# Patient Record
Sex: Female | Born: 1935 | Race: White | Hispanic: No | Smoking: Never smoker
Health system: Southern US, Community
[De-identification: ages and names within clinical notes are randomized; demographics above are authoritative.]

## PROBLEM LIST (undated history)

## (undated) DIAGNOSIS — M79602 Pain in left arm: Secondary | ICD-10-CM

## (undated) DIAGNOSIS — M5412 Radiculopathy, cervical region: Secondary | ICD-10-CM

## (undated) DIAGNOSIS — M1990 Unspecified osteoarthritis, unspecified site: Secondary | ICD-10-CM

---

## 2010-08-26 ENCOUNTER — Ambulatory Visit: Payer: Self-pay | Admitting: Internal Medicine

## 2010-11-03 ENCOUNTER — Ambulatory Visit: Payer: Self-pay | Admitting: Internal Medicine

## 2011-06-09 ENCOUNTER — Ambulatory Visit: Payer: Self-pay | Admitting: Internal Medicine

## 2014-10-10 ENCOUNTER — Emergency Department: Payer: Self-pay | Admitting: Emergency Medicine

## 2014-10-10 LAB — COMPREHENSIVE METABOLIC PANEL
ALT: 13 U/L — AB
ANION GAP: 7 (ref 7–16)
Albumin: 4.5 g/dL
Alkaline Phosphatase: 44 U/L
BILIRUBIN TOTAL: 0.8 mg/dL
BUN: 16 mg/dL
CREATININE: 0.9 mg/dL
Calcium, Total: 9.5 mg/dL
Chloride: 99 mmol/L — ABNORMAL LOW
Co2: 28 mmol/L
EGFR (African American): >60
EGFR (Non-African Amer.): 57 — ABNORMAL LOW
Glucose: 144 mg/dL — ABNORMAL HIGH
POTASSIUM: 4.7 mmol/L
SGOT(AST): 18 U/L
SODIUM: 134 mmol/L — AB
TOTAL PROTEIN: 7.6 g/dL

## 2014-10-10 LAB — URINALYSIS, COMPLETE
BILIRUBIN, UR: NEGATIVE
BLOOD: NEGATIVE
Glucose,UR: NEGATIVE mg/dL (ref 0–75)
Ketone: NEGATIVE
Leukocyte Esterase: NEGATIVE
Nitrite: NEGATIVE
PH: 7 (ref 4.5–8.0)
RBC,UR: 1 /HPF (ref 0–5)
Specific Gravity: 1.017 (ref 1.003–1.030)
WBC UR: 2 /HPF (ref 0–5)

## 2014-10-10 LAB — CBC
HCT: 38.8 % (ref 35.0–47.0)
HGB: 12.9 g/dL (ref 12.0–16.0)
MCH: 31.6 pg (ref 26.0–34.0)
MCHC: 33.4 g/dL (ref 32.0–36.0)
MCV: 95 fL (ref 80–100)
Platelet: 300 10*3/uL (ref 150–440)
RBC: 4.1 10*6/uL (ref 3.80–5.20)
RDW: 12 % (ref 11.5–14.5)
WBC: 6.1 10*3/uL (ref 3.6–11.0)

## 2014-10-10 LAB — TROPONIN I: Troponin-I: <0.03 ng/mL

## 2015-07-30 ENCOUNTER — Emergency Department
Admission: EM | Admit: 2015-07-30 | Discharge: 2015-07-30 | Disposition: A | Discharge status: Home / Self Care | Payer: Medicare Other | Attending: Emergency Medicine | Admitting: Emergency Medicine

## 2015-07-30 ENCOUNTER — Emergency Department: Payer: Medicare Other

## 2015-07-30 DIAGNOSIS — R0789 Other chest pain: Secondary | ICD-10-CM | POA: Insufficient documentation

## 2015-07-30 DIAGNOSIS — M25511 Pain in right shoulder: Secondary | ICD-10-CM | POA: Diagnosis present

## 2015-07-30 DIAGNOSIS — R011 Cardiac murmur, unspecified: Secondary | ICD-10-CM | POA: Diagnosis not present

## 2015-07-30 LAB — BASIC METABOLIC PANEL
ANION GAP: 10 (ref 5–15)
BUN: 23 mg/dL — ABNORMAL HIGH (ref 6–20)
CO2: 24 mmol/L (ref 22–32)
CREATININE: 0.9 mg/dL (ref 0.44–1.00)
Calcium: 9.7 mg/dL (ref 8.9–10.3)
Chloride: 102 mmol/L (ref 101–111)
GFR calc non Af Amer: 55 mL/min — ABNORMAL LOW (ref >60)
Glucose, Bld: 129 mg/dL — ABNORMAL HIGH (ref 65–99)
Potassium: 4.9 mmol/L (ref 3.5–5.1)
SODIUM: 136 mmol/L (ref 135–145)

## 2015-07-30 LAB — TROPONIN I: Troponin I: <0.03 ng/mL (ref <0.031)

## 2015-07-30 LAB — CBC
HEMATOCRIT: 37.4 % (ref 35.0–47.0)
Hemoglobin: 12.5 g/dL (ref 12.0–16.0)
MCH: 30.7 pg (ref 26.0–34.0)
MCHC: 33.5 g/dL (ref 32.0–36.0)
MCV: 91.7 fL (ref 80.0–100.0)
Platelets: 270 10*3/uL (ref 150–440)
RBC: 4.07 MIL/uL (ref 3.80–5.20)
RDW: 12.4 % (ref 11.5–14.5)
WBC: 6.4 10*3/uL (ref 3.6–11.0)

## 2015-07-30 MED ORDER — ACETAMINOPHEN 500 MG PO TABS: 1000.0000 mg | ORAL_TABLET | Freq: Once | ORAL | Status: DC

## 2015-07-30 NOTE — Discharge Instructions (Signed)

## 2015-07-30 NOTE — ED Notes (Signed)
Pt awoke with shoulder pain that has now resolved. Pt has no complaints at this time. Family at bedside, no needs identified at this time

## 2015-07-30 NOTE — ED Provider Notes (Signed)
[facility] Emergency Department Provider Note     Time seen: ----------------------------------------- 7:21 AM on 07/30/2015 -----------------------------------------    I have reviewed the triage vital signs and the nursing notes.   HISTORY  Chief Complaint Shoulder Pain    HPI Hannah Dillon is a 82 y.o. female who presents ER with right shoulder pain has now resolved. Family states she has not had any recent physical activity. Family states she does not do much of anything. She denies any fevers, chills, chest pain, shortness of breath, nausea vomiting or diarrhea. Patient does not have any tenderness in the right upper chest wall right shoulder area.   No past medical history on file.  There are no active problems to display for this patient.   No past surgical history on file.  Allergies Review of patient's allergies indicates no known allergies.  Social History Social History  Substance Use Topics  . Smoking status: Not on file  . Smokeless tobacco: Not on file  . Alcohol Use: Not on file    Review of Systems Constitutional: Negative for fever. Eyes: Negative for visual changes. ENT: Negative for sore throat. Cardiovascular: Positive for right upper chest and shoulder pain Respiratory: Negative for shortness of breath. Gastrointestinal: Negative for abdominal pain, vomiting and diarrhea. Genitourinary: Negative for dysuria. Musculoskeletal: Positive for right shoulder pain Skin: Negative for rash. Neurological: Negative for headaches, focal weakness or numbness.  10-point ROS otherwise negative.  ____________________________________________   PHYSICAL EXAM:  VITAL SIGNS: ED Triage Vitals  Enc Vitals Group     BP 07/30/15 0541 185/86 mmHg     Pulse Rate 07/30/15 0541 75     Resp 07/30/15 0541 18     Temp 07/30/15 0541 97.6 F (36.4 C)     Temp Source 07/30/15 0541 Oral     SpO2 07/30/15 0541 97 %     Weight 07/30/15  0541 110 lb (49.896 kg)     Height 07/30/15 0541 5\' 4"  (1.626 m)     Head Cir --      Peak Flow --      Pain Score 07/30/15 0543 10     Pain Loc --      Pain Edu? --      Excl. in GC? --     Constitutional: Alert and oriented. Well appearing and in no distress. Eyes: Conjunctivae are normal. PERRL. Normal extraocular movements. ENT   Head: Normocephalic and atraumatic.   Nose: No congestion/rhinnorhea.   Mouth/Throat: Mucous membranes are moist.   Neck: No stridor. Cardiovascular: Normal rate, regular rhythm. 2/6 systolic murmur. Respiratory: Normal respiratory effort without tachypnea nor retractions. Breath sounds are clear and equal bilaterally. No wheezes/rales/rhonchi. Gastrointestinal: Soft and nontender. No distention. No abdominal bruits.  Musculoskeletal: Nontender with normal range of motion in all extremities. No pain elicited with range of motion of the right shoulder. Neurologic:  Normal speech and language. No gross focal neurologic deficits are appreciated. Speech is normal. No gait instability. Skin:  Skin is warm, dry and intact. No rash noted. Psychiatric: Mood and affect are normal. Speech and behavior are normal. Patient exhibits appropriate insight and judgment. ____________________________________________  EKG: Interpreted by me. Sinus bradycardia with a rate of 56 bpm, normal PR interval, normal QRS, normal QT interval. Normal axis.  ____________________________________________  ED COURSE:  Pertinent labs & imaging results that were available during my care of the patient were reviewed by me and considered in my medical decision making (see chart for details). Patient  is no acute distress, likely noncardiac chest and shoulder pain. We'll check basic labs and x-ray and reevaluate. ____________________________________________    LABS (pertinent positives/negatives)  Labs Reviewed  BASIC METABOLIC PANEL - Abnormal; Notable for the following:     Glucose, Bld 129 (*)    BUN 23 (*)    GFR calc non Af Amer 55 (*)    All other components within normal limits  TROPONIN I  CBC    RADIOLOGY Images were viewed by me  Chest x-ray IMPRESSION: No active cardiopulmonary disease. ____________________________________________  FINAL ASSESSMENT AND PLAN  Shoulder pain  Plan: Patient with labs and imaging as dictated above. Workup to this point has been negative. I do not think this is pain related to acute coronary syndrome. This likely arthritis or other musculoskeletal pain. She is stable for outpatient follow-up with her doctor.   Emily FilbertWilliams, Kaitlin Ardito E, MD   Emily FilbertJonathan E Khori Underberg, MD 07/30/15 [phone]

## 2015-07-30 NOTE — ED Notes (Signed)
Pt brought to ER by son. Pt reports pain to her right shoulder area that woke her up from sleep. Pt denies other sx.

## 2015-08-10 ENCOUNTER — Ambulatory Visit
Admission: EM | Admit: 2015-08-10 | Discharge: 2015-08-10 | Disposition: A | Discharge status: Home / Self Care | Payer: Medicare Other | Attending: Family Medicine | Admitting: Family Medicine

## 2015-08-10 ENCOUNTER — Encounter: Payer: Self-pay | Admitting: *Deleted

## 2015-08-10 DIAGNOSIS — M25511 Pain in right shoulder: Secondary | ICD-10-CM

## 2015-08-10 MED ORDER — HYDROCODONE-ACETAMINOPHEN 5-325 MG PO TABS: ORAL_TABLET | ORAL | Status: DC

## 2015-08-10 NOTE — ED Notes (Addendum)
Pt states that she has had right shoulder pain for two weeks, has an appt scheduled to see her new PCP next week, Pt was seen at [facility] on 07/30/15 with same concerns.  Pt states that tylenol arthritis is helping pain.  Pt denies any pain at this time.

## 2015-08-10 NOTE — ED Provider Notes (Signed)
CSN: [id number]     Arrival date & time 08/10/15  1621 History   First MD Initiated Contact with Patient 08/10/15 1747     Chief Complaint  Patient presents with  . Shoulder Pain   (Consider location/radiation/quality/duration/timing/severity/associated sxs/prior Treatment) HPI Comments: 82 yo female with a 2-3 weeks h/o intermittent right shoulder pain. Denies any trauma, injuries, falls, swelling, redness, numbness/tingling.   The history is provided by the patient and a relative.    History reviewed. No pertinent past medical history. History reviewed. No pertinent past surgical history. No family history on file. Social History  Substance Use Topics  . Smoking status: Never Smoker   . Smokeless tobacco: None  . Alcohol Use: No   OB History    No data available     Review of Systems  Allergies  Review of patient's allergies indicates no known allergies.  Home Medications   Prior to Admission medications   Medication Sig Start Date End Date Taking? Authorizing Provider  acetaminophen (TYLENOL) 650 MG CR tablet Take 1,300 mg by mouth every 8 (eight) hours as needed for pain.   Yes Historical Provider, MD  HYDROcodone-acetaminophen (NORCO/VICODIN) 5-325 MG tablet 1 tab po qd prn 08/10/15   Payton Mccallum, MD   Meds Ordered and Administered this Visit  Medications - No data to display  BP 146/62 mmHg  Pulse 63  Temp(Src) 97.7 F (36.5 C)  Ht  (1.575 m)  Wt 110 lb (49.896 kg)  BMI 20.11 kg/m2  SpO2 98% No data found.   Physical Exam  Constitutional: She appears well-developed and well-nourished. No distress.  Musculoskeletal: She exhibits no edema.       Right shoulder: Normal.  Neurological: She is alert.  Skin: She is not diaphoretic.  Nursing note and vitals reviewed.   ED Course  Procedures (including critical care time)  Labs Review Labs Reviewed - No data to display  Imaging Review No results found.   Visual Acuity Review  Right Eye  Distance:   Left Eye Distance:   Bilateral Distance:    Right Eye Near:   Left Eye Near:    Bilateral Near:         MDM   1. Shoulder pain, right   (likely secondary to arthritis)    Discharge Medication List as of 08/10/2015  5:59 PM    START taking these medications   Details  HYDROcodone-acetaminophen (NORCO/VICODIN) 5-325 MG tablet 1 tab po qd prn, Print       1.  diagnosis reviewed with patient and son 2. rx as per orders above; reviewed possible side effects, interactions, risks and benefits  3. Recommend supportive treatment with heat, otc analgesics, range of motion exercises 4. Follow-up prn if symptoms worsen or don't improve    Payton Mccallum, MD 08/10/15 [phone]

## 2015-08-31 ENCOUNTER — Encounter: Payer: Self-pay | Admitting: *Deleted

## 2015-08-31 ENCOUNTER — Ambulatory Visit (INDEPENDENT_AMBULATORY_CARE_PROVIDER_SITE_OTHER): Payer: Medicare Other

## 2015-08-31 ENCOUNTER — Ambulatory Visit
Admission: EM | Admit: 2015-08-31 | Discharge: 2015-08-31 | Disposition: A | Discharge status: Home / Self Care | Payer: Medicare Other | Attending: Family Medicine | Admitting: Family Medicine

## 2015-08-31 DIAGNOSIS — M25512 Pain in left shoulder: Secondary | ICD-10-CM | POA: Diagnosis not present

## 2015-08-31 DIAGNOSIS — M25552 Pain in left hip: Secondary | ICD-10-CM | POA: Diagnosis not present

## 2015-08-31 DIAGNOSIS — M25511 Pain in right shoulder: Secondary | ICD-10-CM

## 2015-08-31 NOTE — ED Provider Notes (Signed)
[facility]  ____________________________________________  Time seen: Approximately 4:00PM  I have reviewed the triage vital signs and the nursing notes.   HISTORY  Chief Complaint Hip Pain and Arm Pain   HPI Hannah Dillon is a 82 y.o. female presents with son at bedside for the complaints of right and left shoulder pain as well as left hip pain. Patient and son reports that right and left shoulder pain has been present for about 2 months. Patient and son reports that patient has had right shoulder x-rays which only showed arthritis. Patient denies change in right shoulder pain. Patient states that pain at rest to shoulders or left hip. Patient states that pain is present with active range of motion. Reports right and left shoulder pain present 2 months, unchanged. Patient's son reports left hip pain 1 day.  Denies fall, trauma or known injury. Denies known trigger for pain. Denies pain radiations. States shoulder pains do not radiate and are not present at rest. States shoulder pains are only present with active range of motion. States that shoulder pains are fully reproducible with range of motion. States left hip pain is only present with ambulation.  Patient and son report that patient has been seen by urgent care, ER and PCP for shoulder pain. Reports that she still has Vicodin as needed for pain in which they take half of the tablet once to twice a day at most as needed. Also reports has been taking over-the-counter aleve. Reports of these pain medications do help with pain. Also reports they have Voltaren gel at home for shoulder pain as needed.  With patient sitting currently in chair, patient denies pain. While sitting patient denies pain and shoulders, hips or other pain. Denies chest pain, shortness of breath, dizziness, weakness, vision changes, dysuria, abdominal pain, back or neck pain.  Reports that they have an appointment with orthopedic PA Dedra Skeens tomorrow at  2:30 PM.  PCP: Elmer Ramp   History reviewed. No pertinent past medical history.  There are no active problems to display for this patient.   History reviewed. No pertinent past surgical history.  Current Outpatient Rx  Name  Route  Sig  Dispense  Refill  . acetaminophen (TYLENOL) 650 MG CR tablet   Oral   Take 1,300 mg by mouth every 8 (eight) hours as needed for pain.         Marland Kitchen HYDROcodone-acetaminophen (NORCO/VICODIN) 5-325 MG tablet      1 tab po qd prn   10 tablet   0     Allergies Review of patient's allergies indicates no known allergies.  History reviewed. No pertinent family history.  Social History Social History  Substance Use Topics  . Smoking status: Never Smoker   . Smokeless tobacco: None  . Alcohol Use: No    Review of Systems Constitutional: No fever/chills Eyes: No visual changes. ENT: No sore throat. Cardiovascular: Denies chest pain. Respiratory: Denies shortness of breath. Gastrointestinal: No abdominal pain.  No nausea, no vomiting.  No diarrhea.  No constipation. Genitourinary: Negative for dysuria. Musculoskeletal: Negative for back pain. Shoulder and left hip pain.  Skin: Negative for rash. Neurological: Negative for headaches, focal weakness or numbness.  10-point ROS otherwise negative.  ____________________________________________   PHYSICAL EXAM:  VITAL SIGNS: ED Triage Vitals  Enc Vitals Group     BP 08/31/15 1512 151/69 mmHg     Pulse Rate 08/31/15 1512 68     Resp 08/31/15 1512 18     Temp 08/31/15  1512 98.5 F (36.9 C)     Temp Source 08/31/15 1512 Oral     SpO2 08/31/15 1512 97 %     Weight 08/31/15 1512 125 lb (56.7 kg)     Height 08/31/15 1512  (1.626 m)     Head Cir --      Peak Flow --      Pain Score 08/31/15 1515 5     Pain Loc --      Pain Edu? --      Excl. in GC? --     Constitutional: Alert and oriented. Well appearing and in no acute distress. Eyes: Conjunctivae are normal. PERRL. EOMI. Head:  Atraumatic.   Nose: No congestion/rhinnorhea.  Mouth/Throat: Mucous membranes are moist.   Neck: No stridor.  No cervical spine tenderness to palpation. Hematological/Lymphatic/Immunilogical: No cervical lymphadenopathy. Cardiovascular: Normal rate, regular rhythm. Grossly normal heart sounds.  Good peripheral circulation. Respiratory: Normal respiratory effort.  No retractions. Lungs CTAB. Gastrointestinal: Soft and nontender. No distention. Normal Bowel sounds.  No abdominal bruits. No CVA tenderness. Musculoskeletal: No lower or upper extremity tenderness nor edema.  No joint effusions. Bilateral pedal pulses equal and easily palpated. No cervical, thoracic, or lumbar tenderness to palpation. Steady gait.  Except: Left lateral hip mild TTP, full ROM, no swelling or ecchymosis, no pain with flexion or abduction. Bilateral shoulders mild TTP anteriorly and proximally, pain increased with rotation, no distal upper extremity tenderness or pain, no swelling, no ecchymosis, full ROM, bilateral hand grips equal, bilateral distal radial pulses equal and easily palpable.  Neurologic:  Normal speech and language. No gross focal neurologic deficits are appreciated. No gait instability. Skin:  Skin is warm, dry and intact. No rash noted. Psychiatric: Mood and affect are normal. Speech and behavior are normal.  ____________________________________________   LABS (all labs ordered are listed, but only abnormal results are displayed)  Labs Reviewed - No data to display  RADIOLOGY  EXAM: LEFT SHOULDER - 2+ VIEW  COMPARISON: None.  FINDINGS: Visualized portion of the left hemithorax is normal. Degenerative irregularity of the acromioclavicular joint and rotator cuff insertion. No acute fracture or dislocation. There is also glenohumeral joint space narrowing and subchondral sclerosis.  IMPRESSION: Degenerative change, without acute osseous finding.   Electronically Signed By: Jeronimo Greaves  M.D. On: 08/31/2015 16:41          DG HIP UNILAT WITH PELVIS 2-3 VIEWS LEFT (Final result) Result time: 08/31/15 16:42:32   Final result by Rad Results In Interface (08/31/15 16:42:32)   Narrative:   CLINICAL DATA: 82 year old female with bilateral shoulder and left hip pain  EXAM: DG HIP (WITH OR WITHOUT PELVIS) 2-3V LEFT  COMPARISON: None.  FINDINGS: There is no evidence of hip fracture or dislocation. L4-L5 degenerative disc disease. No significant hip joint arthropathy.  IMPRESSION: 1. No acute fracture, malalignment, bony lesion or significant degenerative arthritis in the hip. 2. Lower lumbar degenerative disc disease.   Electronically Signed By: Malachy Moan M.D. On: 08/31/2015 16:42     I, Renford Dills, personally viewed and evaluated these images (plain radiographs) as part of my medical decision making, as well as reviewing the written report by the radiologist.  ____________________________________________   PROCEDURES  Procedure(s) performed:   Dan Humphreys sized and sent home with patient by RN.  ____________________________________________   INITIAL IMPRESSION / ASSESSMENT AND PLAN / ED COURSE  Pertinent labs & imaging results that were available during my care of the patient were reviewed by me and considered in  my medical decision making (see chart for details).  Very well-appearing patient. No acute distress. Alert and oriented. No focal neurological deficits. Presents with son at bedside who lives with patient. Presents for the complaint of bilateral shoulder pain 2 months as well as left hip pain 1 day. Denies fall or trauma. Denies known trigger. Patient quickly changes positions from sitting to standing in room. Patient quickly ambulatory in room. Patient with mild left antalgic gait but steady. Patient with full range of motion to hips as well as bilateral shoulders. Denies pain at rest. Mild TTP to shoulders and left hip with  direct palpation. Pain to shoulders and left hip with active range of motion. Patient reports that pain is fully reproducible by direct palpation and active range of motion. Suspect musculoskeletal pain.  Will evaluate x-rays of left shoulder as well as left hip. As patient without recent trauma and with recent right shoulder x-ray will avoid repeating x-ray at this time.  Left shoulder x-ray degenerative change without acute osseous finding per radiologist. Left hip no acute fracture, malalignment, bony lesion or significant degenerative arthritis in the hip, lumbar lower degenerative disc disease per radiologist.  Suspect musculoskeletal pain and suspect secondary to arthritic. Discussed close follow-up with PCP as well as following up with orthopedic tomorrow. Patient and son reports has medications at home that they can use for pain and denies request for additional medication. Full range of motion to bilateral upper and lower extremities in room. Mild antalgic gait. Walker sent home with patient and son.  Discussed follow up with Primary care physician this week. Discussed follow up and return parameters including no resolution or any worsening concerns. Patient verbalized understanding and agreed to plan.   ____________________________________________   FINAL CLINICAL IMPRESSION(S) / ED DIAGNOSES  Final diagnoses:  Left shoulder pain  Right shoulder pain  Left hip pain      Note: This dictation was prepared with Dragon dictation along with smaller phrase technology. Any transcriptional errors that result from this process are unintentional.    Renford Dills, NP 09/05/15 1749

## 2015-08-31 NOTE — ED Notes (Signed)
Patient started having left hip pain last PM before she went to sleep. Her left arm started hurting today.

## 2015-08-31 NOTE — Discharge Instructions (Signed)
Take home medication as prescribed. Use walker. Alternate heat and ice for comfort.   Follow up with orthopedic tomorrow as scheduled.   Follow up with your primary care physician this week as needed. Return to Urgent care for new or worsening concerns.    Musculoskeletal Pain Musculoskeletal pain is muscle and boney aches and pains. These pains can occur in any part of the body. Your caregiver may treat you without knowing the cause of the pain. They may treat you if blood or urine tests, X-rays, and other tests were normal.  CAUSES There is often not a definite cause or reason for these pains. These pains may be caused by a type of germ (virus). The discomfort may also come from overuse. Overuse includes working out too hard when your body is not fit. Boney aches also come from weather changes. Bone is sensitive to atmospheric pressure changes. HOME CARE INSTRUCTIONS   Ask when your test results will be ready. Make sure you get your test results.  Only take over-the-counter or prescription medicines for pain, discomfort, or fever as directed by your caregiver. If you were given medications for your condition, do not drive, operate machinery or power tools, or sign legal documents for 24 hours. Do not drink alcohol. Do not take sleeping pills or other medications that may interfere with treatment.  Continue all activities unless the activities cause more pain. When the pain lessens, slowly resume normal activities. Gradually increase the intensity and duration of the activities or exercise.  During periods of severe pain, bed rest may be helpful. Lay or sit in any position that is comfortable.  Putting ice on the injured area.  Put ice in a bag.  Place a towel between your skin and the bag.  Leave the ice on for 15 to 20 minutes, 3 to 4 times a day.  Follow up with your caregiver for continued problems and no reason can be found for the pain. If the pain becomes worse or does not go  away, it may be necessary to repeat tests or do additional testing. Your caregiver may need to look further for a possible cause. SEEK IMMEDIATE MEDICAL CARE IF:  You have pain that is getting worse and is not relieved by medications.  You develop chest pain that is associated with shortness or breath, sweating, feeling sick to your stomach (nauseous), or throw up (vomit).  Your pain becomes localized to the abdomen.  You develop any new symptoms that seem different or that concern you. MAKE SURE YOU:   Understand these instructions.  Will watch your condition.  Will get help right away if you are not doing well or get worse.   This information is not intended to replace advice given to you by your health care provider. Make sure you discuss any questions you have with your health care provider.   Document Released: 07/02/2005 Document Revised: 09/24/2011 Document Reviewed: 03/06/2013 Elsevier Interactive Patient Education 2016 [employer]  Hip Pain Your hip is the joint between your upper legs and your lower pelvis. The bones, cartilage, tendons, and muscles of your hip joint perform a lot of work each day supporting your body weight and allowing you to move around. Hip pain can range from a minor ache to severe pain in one or both of your hips. Pain may be felt on the inside of the hip joint near the groin, or the outside near the buttocks and upper thigh. You may have swelling or stiffness as  well.  HOME CARE INSTRUCTIONS   Take medicines only as directed by your health care provider.  Apply ice to the injured area:  Put ice in a plastic bag.  Place a towel between your skin and the bag.  Leave the ice on for 15-20 minutes at a time, 3-4 times a day.  Keep your leg raised (elevated) when possible to lessen swelling.  Avoid activities that cause pain.  Follow specific exercises as directed by your health care provider.  Sleep with a pillow between your legs on your  most comfortable side.  Record how often you have hip pain, the location of the pain, and what it feels like. SEEK MEDICAL CARE IF:   You are unable to put weight on your leg.  Your hip is red or swollen or very tender to touch.  Your pain or swelling continues or worsens after 1 week.  You have increasing difficulty walking.  You have a fever. SEEK IMMEDIATE MEDICAL CARE IF:   You have fallen.  You have a sudden increase in pain and swelling in your hip. MAKE SURE YOU:   Understand these instructions.  Will watch your condition.  Will get help right away if you are not doing well or get worse.   This information is not intended to replace advice given to you by your health care provider. Make sure you discuss any questions you have with your health care provider.   Document Released: 12/20/2009 Document Revised: 07/23/2014 Document Reviewed: 02/26/2013 Elsevier Interactive Patient Education 2016 [employer]  Shoulder Pain The shoulder is the joint that connects your arms to your body. The bones that form the shoulder joint include the upper arm bone (humerus), the shoulder blade (scapula), and the collarbone (clavicle). The top of the humerus is shaped like a ball and fits into a rather flat socket on the scapula (glenoid cavity). A combination of muscles and strong, fibrous tissues that connect muscles to bones (tendons) support your shoulder joint and hold the ball in the socket. Small, fluid-filled sacs (bursae) are located in different areas of the joint. They act as cushions between the bones and the overlying soft tissues and help reduce friction between the gliding tendons and the bone as you move your arm. Your shoulder joint allows a wide range of motion in your arm. This range of motion allows you to do things like scratch your back or throw a ball. However, this range of motion also makes your shoulder more prone to pain from overuse and injury. Causes of shoulder  pain can originate from both injury and overuse and usually can be grouped in the following four categories:  Redness, swelling, and pain (inflammation) of the tendon (tendinitis) or the bursae (bursitis).  Instability, such as a dislocation of the joint.  Inflammation of the joint (arthritis).  Broken bone (fracture). HOME CARE INSTRUCTIONS   Apply ice to the sore area.  Put ice in a plastic bag.  Place a towel between your skin and the bag.  Leave the ice on for 15-20 minutes, 3-4 times per day for the first 2 days, or as directed by your health care provider.  Stop using cold packs if they do not help with the pain.  If you have a shoulder sling or immobilizer, wear it as long as your caregiver instructs. Only remove it to shower or bathe. Move your arm as little as possible, but keep your hand moving to prevent swelling.  Squeeze a soft ball or  foam pad as much as possible to help prevent swelling.  Only take over-the-counter or prescription medicines for pain, discomfort, or fever as directed by your caregiver. SEEK MEDICAL CARE IF:   Your shoulder pain increases, or new pain develops in your arm, hand, or fingers.  Your hand or fingers become cold and numb.  Your pain is not relieved with medicines. SEEK IMMEDIATE MEDICAL CARE IF:   Your arm, hand, or fingers are numb or tingling.  Your arm, hand, or fingers are significantly swollen or turn white or blue. MAKE SURE YOU:   Understand these instructions.  Will watch your condition.  Will get help right away if you are not doing well or get worse.   This information is not intended to replace advice given to you by your health care provider. Make sure you discuss any questions you have with your health care provider.   Document Released: 04/11/2005 Document Revised: 07/23/2014 Document Reviewed: 10/25/2014 Elsevier Interactive Patient Education [employer]

## 2015-11-11 ENCOUNTER — Other Ambulatory Visit: Payer: Self-pay | Admitting: Orthopedic Surgery

## 2015-11-11 DIAGNOSIS — M5030 Other cervical disc degeneration, unspecified cervical region: Secondary | ICD-10-CM

## 2015-11-11 DIAGNOSIS — M5412 Radiculopathy, cervical region: Secondary | ICD-10-CM

## 2015-12-07 ENCOUNTER — Ambulatory Visit
Admission: RE | Admit: 2015-12-07 | Discharge: 2015-12-07 | Disposition: A | Discharge status: Home / Self Care | Payer: Medicare Other | Source: Ambulatory Visit | Attending: Orthopedic Surgery | Admitting: Orthopedic Surgery

## 2015-12-07 DIAGNOSIS — M50321 Other cervical disc degeneration at C4-C5 level: Secondary | ICD-10-CM | POA: Diagnosis not present

## 2015-12-07 DIAGNOSIS — M5030 Other cervical disc degeneration, unspecified cervical region: Secondary | ICD-10-CM

## 2015-12-07 DIAGNOSIS — M5412 Radiculopathy, cervical region: Secondary | ICD-10-CM | POA: Insufficient documentation

## 2015-12-07 DIAGNOSIS — M4802 Spinal stenosis, cervical region: Secondary | ICD-10-CM | POA: Insufficient documentation

## 2016-07-11 ENCOUNTER — Emergency Department
Admission: EM | Admit: 2016-07-11 | Discharge: 2016-07-11 | Disposition: A | Discharge status: Home / Self Care | Payer: Medicare Other | Attending: Emergency Medicine | Admitting: Emergency Medicine

## 2016-07-11 ENCOUNTER — Emergency Department: Payer: Medicare Other

## 2016-07-11 ENCOUNTER — Encounter: Payer: Self-pay | Admitting: Emergency Medicine

## 2016-07-11 DIAGNOSIS — R0602 Shortness of breath: Secondary | ICD-10-CM | POA: Diagnosis present

## 2016-07-11 DIAGNOSIS — I10 Essential (primary) hypertension: Secondary | ICD-10-CM | POA: Diagnosis not present

## 2016-07-11 HISTORY — DX: Unspecified osteoarthritis, unspecified site: M19.90

## 2016-07-11 HISTORY — DX: Pain in left arm: M79.602

## 2016-07-11 HISTORY — DX: Radiculopathy, cervical region: M54.12

## 2016-07-11 LAB — CBC
HEMATOCRIT: 35.8 % (ref 35.0–47.0)
HEMOGLOBIN: 12.2 g/dL (ref 12.0–16.0)
MCH: 32.6 pg (ref 26.0–34.0)
MCHC: 34.1 g/dL (ref 32.0–36.0)
MCV: 95.5 fL (ref 80.0–100.0)
Platelets: 340 10*3/uL (ref 150–440)
RBC: 3.74 MIL/uL — ABNORMAL LOW (ref 3.80–5.20)
RDW: 12.3 % (ref 11.5–14.5)
WBC: 8.4 10*3/uL (ref 3.6–11.0)

## 2016-07-11 LAB — TROPONIN I: Troponin I: <0.03 ng/mL (ref <0.03)

## 2016-07-11 LAB — BASIC METABOLIC PANEL
ANION GAP: 6 (ref 5–15)
BUN: 18 mg/dL (ref 6–20)
CHLORIDE: 101 mmol/L (ref 101–111)
CO2: 28 mmol/L (ref 22–32)
Calcium: 9.5 mg/dL (ref 8.9–10.3)
Creatinine, Ser: 0.77 mg/dL (ref 0.44–1.00)
GFR calc Af Amer: >60 mL/min (ref >60)
GFR calc non Af Amer: >60 mL/min (ref >60)
GLUCOSE: 92 mg/dL (ref 65–99)
POTASSIUM: 4.4 mmol/L (ref 3.5–5.1)
Sodium: 135 mmol/L (ref 135–145)

## 2016-07-11 NOTE — Discharge Instructions (Signed)
As we discussed, your workup today was reassuring.  Though we do not know exactly what is causing your symptoms, it appears that you have no emergent medical condition at this time and that you are safe to go home and follow up as recommended in this paperwork.  We also encourage you to follow up as soon as possible with your doctor to discuss her blood pressure.  It was elevated today in the emergency department, but it may be that it is normal the next time you are seen in clinic.  Regardless, please follow up with a discussion with your primary provider.  Please return immediately to the Emergency Department if you develop any new or worsening symptoms that concern you.

## 2016-07-11 NOTE — ED Triage Notes (Addendum)
Pt told son that she was having trouble breathing this morning when he came inside. Pt denies any pain r/t SHOB. Reports no longer feels like having any difficulty breathing and feels better now. Denies cough or fevers. No distress in triage. Pt reports soreness to left arm. Son thinks from disc problems.

## 2016-07-11 NOTE — ED Provider Notes (Signed)
[facility] Emergency Department Provider Note  ____________________________________________   First MD Initiated Contact with Patient 07/11/16 1457     (approximate)  I have reviewed the triage vital signs and the nursing notes.   HISTORY  Chief Complaint Shortness of Breath  Possible age-related dementia at baseline  HPI Hannah Dillon is a 82 y.o. female with no prior history of hypertension or heart disease who presents for evaluation of a brief episode of shortness of breath.  It occurred while she was at rest watching TV on the couch.  She cannot remember exactly what happenedbut she does recall being short of breath for a brief period of time.  The total duration of the symptoms was stiff for less than an hour but she and her son are not sure how long it lasted.  She definitively denies chest pain, nausea, vomiting, abdominal pain, sweating, headache.  She had no weakness nor dizziness.  She has chronic cervical radiculopathy with pain in her left arm but that is not new or different from usual.  She takes several different pain medicines for her radiculopathy but does not take an antiplatelet agent but she does take an NSAID.  The particular made the shortness of breath better or worse and it was mild in intensity.  She has no history of blood clots in her legs or lungs.   Past Medical History:  Diagnosis Date  . Arthritis   . Cervical radiculopathy   . DJD (degenerative joint disease)   . Left arm pain     There are no active problems to display for this patient.   History reviewed. No pertinent surgical history.  Prior to Admission medications   Medication Sig Start Date End Date Taking? Authorizing Provider  acetaminophen (TYLENOL) 650 MG CR tablet Take 1,300 mg by mouth every 8 (eight) hours as needed for pain.    Historical Provider, MD  HYDROcodone-acetaminophen (NORCO/VICODIN) 5-325 MG tablet 1 tab po qd prn 08/10/15   Payton Mccallum, MD      Allergies Patient has no known allergies.  History reviewed. No pertinent family history.  Social History Social History  Substance Use Topics  . Smoking status: Never Smoker  . Smokeless tobacco: Never Used  . Alcohol use No    Review of Systems Constitutional: No fever/chills Eyes: No visual changes. ENT: No sore throat. Cardiovascular: Denies chest pain. Respiratory: +shortness of breath, now resolved Gastrointestinal: No abdominal pain.  No nausea, no vomiting.  No diarrhea.  No constipation. Genitourinary: Negative for dysuria. Musculoskeletal: Negative for back pain. Skin: Negative for rash. Neurological: Negative for headaches, focal weakness or numbness.  10-point ROS otherwise negative.  ____________________________________________   PHYSICAL EXAM:  VITAL SIGNS: ED Triage Vitals [07/11/16 1213]  Enc Vitals Group     BP (!) 180/71     Pulse Rate 68     Resp 16     Temp 98 F (36.7 C)     Temp Source Oral     SpO2 97 %     Weight 125 lb (56.7 kg)     Height 5\' 4"  (1.626 m)     Head Circumference      Peak Flow      Pain Score      Pain Loc      Pain Edu?      Excl. in GC?     Constitutional: Alert and oriented. Well appearing and in no acute distress. Eyes: Conjunctivae are normal. PERRL. EOMI. Head:  Atraumatic. Nose: No congestion/rhinnorhea. Mouth/Throat: Mucous membranes are moist.  Oropharynx non-erythematous. Neck: No stridor.  No meningeal signs.   Cardiovascular: Normal rate, regular rhythm. Good peripheral circulation. Grossly normal heart sounds. Respiratory: Normal respiratory effort.  No retractions. Lungs CTAB. Gastrointestinal: Soft and nontender. No distention.  Musculoskeletal: No lower extremity tenderness nor edema. No gross deformities of extremities. Neurologic:  Normal speech and language. No gross focal neurologic deficits are appreciated.  Skin:  Skin is warm, dry and intact. No rash noted. Psychiatric: Mood and affect  are normal. Speech and behavior are normal.  ____________________________________________   LABS (all labs ordered are listed, but only abnormal results are displayed)  Labs Reviewed  CBC - Abnormal; Notable for the following:       Result Value   RBC 3.74 (*)    All other components within normal limits  BASIC METABOLIC PANEL  TROPONIN I   ____________________________________________  EKG  ED ECG REPORT I, Winson Eichorn, the attending physician, personally viewed and interpreted this ECG.  Date: 07/11/2016 EKG Time: 12:20 PM Rate: 62 Rhythm: normal sinus rhythm QRS Axis: normal Intervals: normal ST/T Wave abnormalities: Inverted T-wave in lead V2, otherwise unremarkable Conduction Disturbances: none Narrative Interpretation: unremarkable, with no evidence of acute ischemia  ____________________________________________  RADIOLOGY   Dg Chest 2 View  Result Date: 07/11/2016 CLINICAL DATA:  Pt having SOB for 1 week. No previous hx. Non smoker EXAM: CHEST  2 VIEW COMPARISON:  07/30/2015 FINDINGS: Normal cardiac silhouette. Lungs are hyperinflated. No effusion, infiltrate or pneumothorax. Degenerative osteophytosis of the spine. IMPRESSION: Hyperinflated lungs.  No acute cardiopulmonary findings. Electronically Signed   By: Genevive BiStewart  Edmunds M.D.   On: 07/11/2016 13:06    ____________________________________________   PROCEDURES  Procedure(s) performed:   Procedures   Critical Care performed: No ____________________________________________   INITIAL IMPRESSION / ASSESSMENT AND PLAN / ED COURSE  Pertinent labs & imaging results that were available during my care of the patient were reviewed by me and considered in my medical decision making (see chart for details).  Well appearing, NAD.  Reassuring physical exam.  I had a long discussion with the patient and her son about various etiologies of the shortness of breath, including pulmonary embolism, ACS, as well  as less potentially dangerous causes.  She is completely asymptomatic at this point and we all agreed that there is no benefit to additional testing and the patient wants to go home.  I also explained the possible utility of a repeat troponin  And they understand but feel that the workup so far has been reassuring and she has been asymptomatic throughout her emergency department stay.  I gave my usual and customary return precautions.  They will follow up with her primary care doctor.  I also encouraged her to speak with her primary care doctor about her blood pressure at the next available opportunity; she has no history of hypertension and she is consistently elevated today.  Given that she is asymptomatic, however, and has no evidence of end organ dysfunction, I believe that she will be better suited to outpatient follow-up rather than me start her on additional medication based on one visit in the ED.  ____________________________________________  FINAL CLINICAL IMPRESSION(S) / ED DIAGNOSES  Final diagnoses:  Shortness of breath  Hypertension, unspecified type     MEDICATIONS GIVEN DURING THIS VISIT:  Medications - No data to display   NEW OUTPATIENT MEDICATIONS STARTED DURING THIS VISIT:  New Prescriptions   No medications on file  Modified Medications   No medications on file    Discontinued Medications   No medications on file     Note:  This document was prepared using Dragon voice recognition software and may include unintentional dictation errors.    Loleta Roseory Tynesia Harral, MD 07/11/16 [phone]

## 2016-07-22 ENCOUNTER — Emergency Department
Admission: EM | Admit: 2016-07-22 | Discharge: 2016-07-22 | Disposition: A | Discharge status: Home / Self Care | Payer: Medicare Other | Attending: Emergency Medicine | Admitting: Emergency Medicine

## 2016-07-22 ENCOUNTER — Emergency Department: Payer: Medicare Other

## 2016-07-22 ENCOUNTER — Encounter: Payer: Self-pay | Admitting: Emergency Medicine

## 2016-07-22 DIAGNOSIS — R0602 Shortness of breath: Secondary | ICD-10-CM | POA: Diagnosis not present

## 2016-07-22 DIAGNOSIS — R42 Dizziness and giddiness: Secondary | ICD-10-CM | POA: Insufficient documentation

## 2016-07-22 DIAGNOSIS — N390 Urinary tract infection, site not specified: Secondary | ICD-10-CM | POA: Insufficient documentation

## 2016-07-22 DIAGNOSIS — R05 Cough: Secondary | ICD-10-CM | POA: Diagnosis present

## 2016-07-22 DIAGNOSIS — M25512 Pain in left shoulder: Secondary | ICD-10-CM | POA: Diagnosis not present

## 2016-07-22 LAB — URINALYSIS, COMPLETE (UACMP) WITH MICROSCOPIC
BILIRUBIN URINE: NEGATIVE
Glucose, UA: NEGATIVE mg/dL
Hgb urine dipstick: NEGATIVE
Ketones, ur: NEGATIVE mg/dL
Nitrite: POSITIVE — AB
PH: 9 — AB (ref 5.0–8.0)
Protein, ur: NEGATIVE mg/dL
SPECIFIC GRAVITY, URINE: 1.01 (ref 1.005–1.030)

## 2016-07-22 LAB — CBC
HEMATOCRIT: 37.4 % (ref 35.0–47.0)
Hemoglobin: 12.9 g/dL (ref 12.0–16.0)
MCH: 32.8 pg (ref 26.0–34.0)
MCHC: 34.4 g/dL (ref 32.0–36.0)
MCV: 95.4 fL (ref 80.0–100.0)
PLATELETS: 338 10*3/uL (ref 150–440)
RBC: 3.92 MIL/uL (ref 3.80–5.20)
RDW: 12.3 % (ref 11.5–14.5)
WBC: 7.9 10*3/uL (ref 3.6–11.0)

## 2016-07-22 LAB — BASIC METABOLIC PANEL
Anion gap: 7 (ref 5–15)
BUN: 21 mg/dL — AB (ref 6–20)
CHLORIDE: 101 mmol/L (ref 101–111)
CO2: 26 mmol/L (ref 22–32)
CREATININE: 0.72 mg/dL (ref 0.44–1.00)
Calcium: 9.6 mg/dL (ref 8.9–10.3)
GFR calc Af Amer: >60 mL/min (ref >60)
GFR calc non Af Amer: >60 mL/min (ref >60)
GLUCOSE: 121 mg/dL — AB (ref 65–99)
POTASSIUM: 4 mmol/L (ref 3.5–5.1)
SODIUM: 134 mmol/L — AB (ref 135–145)

## 2016-07-22 LAB — GLUCOSE, CAPILLARY: GLUCOSE-CAPILLARY: 112 mg/dL — AB (ref 65–99)

## 2016-07-22 MED ORDER — CEFTRIAXONE SODIUM 1 G IJ SOLR: 1.0000 g | Freq: Once | INTRAMUSCULAR | Status: DC

## 2016-07-22 MED ORDER — CEPHALEXIN 500 MG PO CAPS: 500.0000 mg | ORAL_CAPSULE | Freq: Three times a day (TID) | ORAL | 0 refills | Status: DC

## 2016-07-22 MED ORDER — MECLIZINE HCL 25 MG PO TABS
25.0000 mg | ORAL_TABLET | Freq: Once | ORAL | Status: AC
  Administered 2016-07-22: 25 mg via ORAL
  Filled 2016-07-22: qty 1

## 2016-07-22 MED ORDER — CEFTRIAXONE SODIUM-DEXTROSE 1-3.74 GM-% IV SOLR
1.0000 g | Freq: Once | INTRAVENOUS | Status: AC
  Administered 2016-07-22: 1 g via INTRAVENOUS
  Filled 2016-07-22: qty 50

## 2016-07-22 NOTE — ED Triage Notes (Signed)
Pt arrived by EMS from home with c/o of dizzyness that started last night. Pt told EMS that everytime she tries to stand she feels like she is falling. EMS BP on arrival 210/90. Pt takes no meds. Pt has chronic neck and back pain.

## 2016-07-22 NOTE — ED Notes (Signed)
Pt and Pt's son verbalized understanding of discharge instructions. NAD at this time. 

## 2016-07-22 NOTE — ED Provider Notes (Signed)
[facility] Emergency Department Provider Note  ____________________________________________   I have reviewed the triage vital signs and the nursing notes.   HISTORY  Chief Complaint Shortness of breath and left shoulder pain  History limited by: Not Limited   HPI Hannah Dillon is a 83 y.o. female who presents to the emergency department today because of concern for shortness of breath and left shoulder pain. She states the shortness of breath started last night. It is not accompanied by any chest pain or cough. In addition the patient is continuing to complain of left shoulder pain. It sounds like she does have some disc disease, this is a known entity and family states that she is [facility] for [facility] issue. Lastly the patient did complain of some problems with her equilibrium. It sounds that it is mostly when the patient sits up. Had vertigo a number of years ago. Was seen in the ED roughly 1 week ago for shortness of breath, work up at that time negative, patient states tonights shortness of breath is the same.    Past Medical History:  Diagnosis Date  . Arthritis   . Cervical radiculopathy   . DJD (degenerative joint disease)   . Left arm pain     There are no active problems to display for this patient.   History reviewed. No pertinent surgical history.  Prior to Admission medications   Medication Sig Start Date End Date Taking? Authorizing Provider  acetaminophen (TYLENOL) 650 MG CR tablet Take 1,300 mg by mouth every 8 (eight) hours as needed for pain.    Historical Provider, MD  HYDROcodone-acetaminophen (NORCO/VICODIN) 5-325 MG tablet 1 tab po qd prn 08/10/15   Payton Mccallumrlando Conty, MD    Allergies Patient has no known allergies.  History reviewed. No pertinent family history.  Social History Social History  Substance Use Topics  . Smoking status: Never Smoker  . Smokeless tobacco: Never Used  . Alcohol use No    Review of  Systems  Constitutional: Negative for fever. Cardiovascular: Negative for chest pain. Respiratory: Positive for shortness of breath. Gastrointestinal: Negative for abdominal pain, vomiting and diarrhea. Genitourinary: Negative for dysuria. Musculoskeletal: Positive for left shoulder pain. Skin: Negative for rash. Neurological: Negative for headaches, focal weakness or numbness.  10-point ROS otherwise negative.  ____________________________________________   PHYSICAL EXAM:  VITAL SIGNS: ED Triage Vitals  Enc Vitals Group     BP 07/22/16 0713 (!) 172/66     Pulse Rate 07/22/16 0713 (!) 55     Resp 07/22/16 0713 (!) 29     Temp 07/22/16 0713 97.8 F (36.6 C)     Temp Source 07/22/16 0713 Oral     SpO2 07/22/16 0708 98 %     Weight 07/22/16 0711 125 lb (56.7 kg)     Height 07/22/16 0711 5\' 4"  (1.626 m)   Constitutional: Alert and oriented. Well appearing and in no distress. Eyes: Conjunctivae are normal. Normal extraocular movements. ENT   Head: Normocephalic and atraumatic.   Nose: No congestion/rhinnorhea.   Mouth/Throat: Mucous membranes are moist.   Neck: No stridor. Hematological/Lymphatic/Immunilogical: No cervical lymphadenopathy. Cardiovascular: Normal rate, regular rhythm.  No murmurs, rubs, or gallops.  Respiratory: Normal respiratory effort without tachypnea nor retractions. Breath sounds are clear and equal bilaterally. No wheezes/rales/rhonchi. Gastrointestinal: Soft and non tender. No rebound. No guarding.  Genitourinary: Deferred Musculoskeletal: Normal range of motion in all extremities. No lower extremity edema. Neurologic:  Normal speech and language. No gross  focal neurologic deficits are appreciated.  Skin:  Skin is warm, dry and intact. No rash noted. Psychiatric: Mood and affect are normal. Speech and behavior are normal. Patient exhibits appropriate insight and judgment.  ____________________________________________    LABS (pertinent  positives/negatives)  Labs Reviewed  BASIC METABOLIC PANEL - Abnormal; Notable for the following:       Result Value   Sodium 134 (*)    Glucose, Bld 121 (*)    BUN 21 (*)    All other components within normal limits  URINALYSIS, COMPLETE (UACMP) WITH MICROSCOPIC - Abnormal; Notable for the following:    Color, Urine YELLOW (*)    APPearance HAZY (*)    pH 9.0 (*)    Nitrite POSITIVE (*)    Leukocytes, UA LARGE (*)    Bacteria, UA MANY (*)    Squamous Epithelial / LPF 6-30 (*)    All other components within normal limits  GLUCOSE, CAPILLARY - Abnormal; Notable for the following:    Glucose-Capillary 112 (*)    All other components within normal limits  URINE CULTURE  CBC  CBG MONITORING, ED     ____________________________________________   EKG I, Phineas Semen, attending physician, personally viewed and interpreted this EKG  EKG Time: 0716 Rate: 51 Rhythm: sinus bradycardia Axis: left axis deviation Intervals: qtc 407 QRS: narrow, q waves V1 ST changes: no st elevation Impression: abnormal ekg   ____________________________________________    RADIOLOGY  IMPRESSION:  1. No radiographic evidence of acute cardiopulmonary disease.  2. Aortic atherosclerosis.     __________________________________________   PROCEDURES  Procedures  ____________________________________________   INITIAL IMPRESSION / ASSESSMENT AND PLAN / ED COURSE  Pertinent labs & imaging results that were available during my care of the patient were reviewed by me and considered in my medical decision making (see chart for details).  Patient here because of some dizziness. Also complaining of shortness of breath and left shoulder pain. The latter two symptoms patient has been evaluated for in the past. The patient was found to have a UTI. Was given dose of IV abx here in the emergency department. Will give prescription for abx.    ____________________________________________   FINAL CLINICAL IMPRESSION(S) / ED DIAGNOSES  Final diagnoses:  Dizziness  Urinary tract infection without hematuria, site unspecified     Note: This dictation was prepared with Dragon dictation. Any transcriptional errors that result from this process are unintentional     Phineas Semen, MD 07/22/16 1148

## 2016-07-22 NOTE — Discharge Instructions (Signed)
Please seek medical attention for any high fevers, chest pain, shortness of breath, change in behavior, persistent vomiting, bloody stool or any other new or concerning symptoms.  

## 2016-07-25 LAB — URINE CULTURE

## 2019-03-11 DIAGNOSIS — F0150 Vascular dementia without behavioral disturbance: Secondary | ICD-10-CM | POA: Insufficient documentation

## 2019-12-09 ENCOUNTER — Emergency Department
Admission: EM | Admit: 2019-12-09 | Discharge: 2019-12-10 | Disposition: A | Discharge status: Home / Self Care | Payer: Medicare Other | Attending: Emergency Medicine | Admitting: Emergency Medicine

## 2019-12-09 ENCOUNTER — Emergency Department: Payer: Medicare Other

## 2019-12-09 ENCOUNTER — Other Ambulatory Visit: Payer: Self-pay

## 2019-12-09 DIAGNOSIS — Z79899 Other long term (current) drug therapy: Secondary | ICD-10-CM | POA: Insufficient documentation

## 2019-12-09 DIAGNOSIS — R0789 Other chest pain: Secondary | ICD-10-CM | POA: Diagnosis not present

## 2019-12-09 DIAGNOSIS — M25512 Pain in left shoulder: Secondary | ICD-10-CM | POA: Diagnosis not present

## 2019-12-09 DIAGNOSIS — R079 Chest pain, unspecified: Secondary | ICD-10-CM

## 2019-12-09 LAB — BASIC METABOLIC PANEL
Anion gap: 9 (ref 5–15)
BUN: 38 mg/dL — ABNORMAL HIGH (ref 8–23)
CO2: 27 mmol/L (ref 22–32)
Calcium: 9.3 mg/dL (ref 8.9–10.3)
Chloride: 106 mmol/L (ref 98–111)
Creatinine, Ser: 0.89 mg/dL (ref 0.44–1.00)
GFR calc Af Amer: >60 mL/min (ref >60)
GFR calc non Af Amer: 55 mL/min — ABNORMAL LOW (ref >60)
Glucose, Bld: 159 mg/dL — ABNORMAL HIGH (ref 70–99)
Potassium: 4.2 mmol/L (ref 3.5–5.1)
Sodium: 142 mmol/L (ref 135–145)

## 2019-12-09 LAB — CBC
HCT: 35.4 % — ABNORMAL LOW (ref 36.0–46.0)
Hemoglobin: 11.2 g/dL — ABNORMAL LOW (ref 12.0–15.0)
MCH: 32 pg (ref 26.0–34.0)
MCHC: 31.6 g/dL (ref 30.0–36.0)
MCV: 101.1 fL — ABNORMAL HIGH (ref 80.0–100.0)
Platelets: 234 10*3/uL (ref 150–400)
RBC: 3.5 MIL/uL — ABNORMAL LOW (ref 3.87–5.11)
RDW: 12 % (ref 11.5–15.5)
WBC: 6.8 10*3/uL (ref 4.0–10.5)
nRBC: 0 % (ref 0.0–0.2)

## 2019-12-09 LAB — TROPONIN I (HIGH SENSITIVITY)
Troponin I (High Sensitivity): <2 ng/L (ref <18)
Troponin I (High Sensitivity): 3 ng/L (ref <18)

## 2019-12-09 MED ORDER — MORPHINE SULFATE (PF) 4 MG/ML IV SOLN: 4.0000 mg | Freq: Once | INTRAVENOUS | Status: DC

## 2019-12-09 MED ORDER — DEXAMETHASONE SODIUM PHOSPHATE 10 MG/ML IJ SOLN: 10.0000 mg | Freq: Once | INTRAMUSCULAR | Status: DC

## 2019-12-09 NOTE — ED Provider Notes (Signed)
[facility] Department Provider Note  ____________________________________________  Time seen: Approximately 8:59 PM  I have reviewed the triage vital signs and the nursing notes.   HISTORY  Chief Complaint Shoulder Pain    HPI Hannah Dillon is a 86 y.o. female who presents the emergency department complaining of left-sided chest and shoulder pain.  Patient states that pain has been ongoing times today.  Patient has a long history of left shoulder neck, arm and leg pain giving degenerative disc disease status.  Patient states that this is similar in regards to the type of pain but slightly different location than normal.  Patient denies any associated shortness of breath, palpitations.  No radiation.  Patient with no cardiac history.  No chronic lung problems.  Patient states that palpation does not reproduce the symptoms.  No recent trauma.  Patient has had no URI complaints of fevers, chills, nasal congestion, sore throat, cough, shortness of breath.  Patient is on multiple medications at home to include  Xtampsa, Vicodin, gabapentin.  Patient denies any GI or urinary complaints.        Past Medical History:  Diagnosis Date  . Arthritis   . Cervical radiculopathy   . DJD (degenerative joint disease)   . Left arm pain     There are no problems to display for this patient.   History reviewed. No pertinent surgical history.  Prior to Admission medications   Medication Sig Start Date End Date Taking? Authorizing Provider  acetaminophen (TYLENOL) 650 MG CR tablet Take 1,300 mg by mouth every 8 (eight) hours as needed for pain.    [provider]  cephALEXin (KEFLEX) 500 MG capsule Take 1 capsule (500 mg total) by mouth 3 (three) times daily. 07/22/16   Nance Pear, MD  gabapentin (NEURONTIN) 100 MG capsule Take 2 capsules by mouth at bedtime. 06/11/16   [provider]  HYDROcodone-acetaminophen (NORCO/VICODIN) 5-325 MG  tablet 1 tab po qd prn Patient taking differently: Take 1 tablet by mouth daily as needed. 1 tab po qd prn 08/10/15   Norval Gable, MD  predniSONE (DELTASONE) 10 MG tablet Take 1 tablet (10 mg total) by mouth daily. 12/10/19   Rennee Coyne, Charline Bills, PA-C    Allergies Patient has no known allergies.  No family history on file.  Social History Social History   Tobacco Use  . Smoking status: Never Smoker  . Smokeless tobacco: Never Used  Substance Use Topics  . Alcohol use: No  . Drug use: No     Review of Systems  Constitutional: No fever/chills Eyes: No visual changes. No discharge ENT: No upper respiratory complaints. Cardiovascular: Left-sided chest pain Respiratory: no cough. No SOB. Gastrointestinal: No abdominal pain.  No nausea, no vomiting.  No diarrhea.  No constipation. Genitourinary: Negative for dysuria. No hematuria Musculoskeletal: Negative for musculoskeletal pain. Skin: Negative for rash, abrasions, lacerations, ecchymosis. Neurological: Negative for headaches, focal weakness or numbness. 10-point ROS otherwise negative.  ____________________________________________   PHYSICAL EXAM:  VITAL SIGNS: ED Triage Vitals  Enc Vitals Group     BP 12/09/19 2023 (!) 165/67     Pulse Rate 12/09/19 2023 65     Resp 12/09/19 2023 16     Temp 12/09/19 2023 97.9 F (36.6 C)     Temp Source 12/09/19 2023 Oral     SpO2 12/09/19 2023 94 %     Weight 12/09/19 2026 125 lb (56.7 kg)     Height 12/09/19 2026 5\' 4"  (1.626 m)  Head Circumference --      Peak Flow --      Pain Score 12/09/19 2024 8     Pain Loc --      Pain Edu? --      Excl. in [place]? --      Constitutional: Alert and oriented. Well appearing and in no acute distress. Eyes: Conjunctivae are normal. PERRL. EOMI. Head: Atraumatic. ENT:      Ears:       Nose: No congestion/rhinnorhea.      Mouth/Throat: Mucous membranes are moist.  Neck: No stridor.  No cervical spine tenderness to palpation.   Cardiovascular: Normal rate, regular rhythm. Normal S1 and S2.  No murmurs, rubs, gallops.  No apical heave.  Good peripheral circulation. Respiratory: Normal respiratory effort without tachypnea or retractions. Lungs CTAB. Good air entry to the bases with no decreased or absent breath sounds. Gastrointestinal: Bowel sounds 4 quadrants. Soft and nontender to palpation. No guarding or rigidity. No palpable masses. No distention. No CVA tenderness Musculoskeletal: Full range of motion to all extremities. No gross deformities appreciated.  Visualization of the left chest wall reveals no visible signs of trauma.  Equal chest rise and fall with no paradoxical chest wall movement.  No palpable abnormality or tenderness on exam.  Underlying breath sounds bilaterally. Neurologic:  Normal speech and language. No gross focal neurologic deficits are appreciated.  Skin:  Skin is warm, dry and intact. No rash noted. Psychiatric: Mood and affect are normal. Speech and behavior are normal. Patient exhibits appropriate insight and judgement.   ____________________________________________   LABS (all labs ordered are listed, but only abnormal results are displayed)  Labs Reviewed  BASIC METABOLIC PANEL - Abnormal; Notable for the following components:      Result Value   Glucose, Bld 159 (*)    BUN 38 (*)    GFR calc non Af Amer 55 (*)    All other components within normal limits  CBC - Abnormal; Notable for the following components:   RBC 3.50 (*)    Hemoglobin 11.2 (*)    HCT 35.4 (*)    MCV 101.1 (*)    All other components within normal limits  TROPONIN I (HIGH SENSITIVITY)  TROPONIN I (HIGH SENSITIVITY)   ____________________________________________  EKG  ED ECG REPORT I, Delorise Royals Ailsa Mireles,  personally viewed and interpreted this ECG.   Date: 12/10/2019  EKG Time: 2027 hrs.  Rate: 63 bpm  Rhythm: normal EKG, normal sinus rhythm, unchanged from previous tracings  Axis: Normal axis   Intervals:none  ST&T Change: No ST elevation or depression noted.  Normal sinus rhythm, normal EKG ____________________________________________  RADIOLOGY I personally viewed and evaluated these images as part of my medical decision making, as well as reviewing the written report by the radiologist.  DG Chest Port 1 View  Result Date: 12/09/2019 CLINICAL DATA:  Chest pain EXAM: PORTABLE CHEST 1 VIEW COMPARISON:  July 22, 2016 FINDINGS: The heart size is mildly enlarged. Aortic calcifications are noted. There is no pneumothorax. There are scattered areas of pleuroparenchymal scarring with atelectasis at the lung bases. There is no significant pleural effusion. There is no acute displaced fracture. IMPRESSION: No active disease. Electronically Signed   By: Katherine Mantle M.D.   On: 12/09/2019 21:13    ____________________________________________    PROCEDURES  Procedure(s) performed:    Procedures    Medications  dexamethasone (DECADRON) injection 10 mg (has no administration in time range)  morphine 4 MG/ML injection 4  mg (has no administration in time range)     ____________________________________________   INITIAL IMPRESSION / ASSESSMENT AND PLAN / ED COURSE  Pertinent labs & imaging results that were available during my care of the patient were reviewed by me and considered in my medical decision making (see chart for details).  Review of the [place] CSRS was performed in accordance of the [facility] prior to dispensing any controlled drugs.           Patient's diagnosis is consistent with nonspecific chest pain.  Patient presented to the emergency department complaining of left-sided chest pain.  She states that the symptoms were similar to her typical pain from her radiculopathy.  Location was slightly different with the left shoulder and left chest.  No recent trauma.  Patient reported no numbness or weakness of the upper extremities or lower extremities.  No bowel or  bladder dysfunction, saddle anesthesia or paresthesias.  Patient with no cardiac history.  No palpitations.  She denied the pain feeling like substernal pain she states that it was a sharp sensation in the left rib region.  Differential included ACS/STEMI, pericarditis, bronchitis, pneumonia, tension pneumothorax, PE.  Overall exam was reassuring.  Labs are reassuring at this time.  I have low suspicion for infectious origin.  No indication of PE at this time with no pleuritic pain, no tachycardia, no recent surgeries or travel.  No prolonged immobilization.  With reassuring results, no cardiac history patient is stable for discharge at this time.  Patient will continue her pain medications that she is regularly prescribed and I will prescribe a short course of steroids as well.  Follow-up with primary care as needed.  Patient is given ED precautions to return to the ED for any worsening or new symptoms.     ____________________________________________  FINAL CLINICAL IMPRESSION(S) / ED DIAGNOSES  Final diagnoses:  Nonspecific chest pain      NEW MEDICATIONS STARTED DURING THIS VISIT:  ED Discharge Orders         Ordered    predniSONE (DELTASONE) 10 MG tablet  Daily    Note to Pharmacy: Take 6 pills x 2 days, 5 pills x 2 days, 4 pills x 2 days, 3 pills x 2 days, 2 pills x 2 days, and 1 pill x 2 days   12/10/19 0005              This chart was dictated using voice recognition software/Dragon. Despite best efforts to proofread, errors can occur which can change the meaning. Any change was purely unintentional.    Racheal Patches, PA-C 12/10/19 0012    Charlynne Pander, MD 12/11/19 2107

## 2019-12-09 NOTE — ED Triage Notes (Signed)
Pt arrives [facility] from home w cc of L shoulder, neck, arm and leg pain. Pt has hx degenarative disc in neck that causes chronic pain for the past 4 years.   150/74 HR 72 NSR R 16 95% room air 98.3 temp oral

## 2019-12-10 MED ORDER — PREDNISONE 10 MG PO TABS: 10.0000 mg | ORAL_TABLET | Freq: Every day | ORAL | 0 refills | Status: DC

## 2019-12-10 MED ORDER — PREDNISONE 20 MG PO TABS
60.0000 mg | ORAL_TABLET | Freq: Once | ORAL | Status: AC
  Administered 2019-12-10: 60 mg via ORAL
  Filled 2019-12-10: qty 3

## 2019-12-10 MED ORDER — OXYCODONE-ACETAMINOPHEN 5-325 MG PO TABS
1.0000 | ORAL_TABLET | Freq: Once | ORAL | Status: AC
  Administered 2019-12-10: 1 via ORAL
  Filled 2019-12-10: qty 1

## 2021-07-18 ENCOUNTER — Encounter: Payer: Self-pay | Admitting: Emergency Medicine

## 2021-07-18 ENCOUNTER — Ambulatory Visit
Admission: EM | Admit: 2021-07-18 | Discharge: 2021-07-18 | Disposition: A | Discharge status: Home / Self Care | Payer: Medicare Other | Attending: Physician Assistant | Admitting: Physician Assistant

## 2021-07-18 ENCOUNTER — Telehealth: Payer: Self-pay | Admitting: Physician Assistant

## 2021-07-18 ENCOUNTER — Other Ambulatory Visit: Payer: Self-pay

## 2021-07-18 DIAGNOSIS — I10 Essential (primary) hypertension: Secondary | ICD-10-CM | POA: Insufficient documentation

## 2021-07-18 DIAGNOSIS — J209 Acute bronchitis, unspecified: Secondary | ICD-10-CM | POA: Diagnosis not present

## 2021-07-18 DIAGNOSIS — J019 Acute sinusitis, unspecified: Secondary | ICD-10-CM | POA: Diagnosis not present

## 2021-07-18 DIAGNOSIS — R051 Acute cough: Secondary | ICD-10-CM | POA: Diagnosis not present

## 2021-07-18 DIAGNOSIS — H9190 Unspecified hearing loss, unspecified ear: Secondary | ICD-10-CM | POA: Insufficient documentation

## 2021-07-18 DIAGNOSIS — U071 COVID-19: Secondary | ICD-10-CM | POA: Insufficient documentation

## 2021-07-18 MED ORDER — IPRATROPIUM BROMIDE 0.06 % NA SOLN
2.0000 | Freq: Four times a day (QID) | NASAL | 0 refills | Status: DC
Start: 2021-07-18 — End: 2023-10-05

## 2021-07-18 MED ORDER — BENZONATATE 200 MG PO CAPS: 200.0000 mg | ORAL_CAPSULE | Freq: Three times a day (TID) | ORAL | 0 refills | Status: DC | PRN

## 2021-07-18 MED ORDER — PREDNISONE 20 MG PO TABS: 20.0000 mg | ORAL_TABLET | Freq: Every day | ORAL | 0 refills | Status: AC

## 2021-07-18 MED ORDER — DOXYCYCLINE HYCLATE 100 MG PO CAPS: 100.0000 mg | ORAL_CAPSULE | Freq: Two times a day (BID) | ORAL | 0 refills | Status: AC

## 2021-07-18 NOTE — ED Provider Notes (Signed)
[facility]    CSN: [id number] Arrival date & time: 07/18/21  1130      History   Chief Complaint Chief Complaint  Patient presents with   Nasal Congestion   Cough    HPI Hannah Dillon is a 88 y.o. female presenting with her son for approximately 4 to 5-day history of increased nasal congestion and worsening cough.  Patient son helping to provide some of the history since patient is hard of hearing.  Patient's son reports that she has chronic sinusitis and always has a runny nose and a chronic cough but symptoms have gotten worse over the past few days.  Patient reports sinus pressure.  Denies chest pain.  Son reports that she has a "rattling" in chest.  Denies any respiratory distress or signs of breathing difficulty though.  Patient denies sore throat, chest pain, abdominal pain, vomiting or diarrhea.  Patient son currently has a cough.  Unsure of any COVID or flu exposure.  Patient has not been taking any medicine for her symptoms over-the-counter.  No other complaints.  HPI  Past Medical History:  Diagnosis Date   Arthritis    Cervical radiculopathy    DJD (degenerative joint disease)    Left arm pain     There are no problems to display for this patient.   History reviewed. No pertinent surgical history.  OB History   No obstetric history on file.      Home Medications    Prior to Admission medications   Medication Sig Start Date End Date Taking? Authorizing Provider  acetaminophen (TYLENOL) 650 MG CR tablet Take 1,300 mg by mouth every 8 (eight) hours as needed for pain.   Yes [provider]  benzonatate (TESSALON) 200 MG capsule Take 1 capsule (200 mg total) by mouth 3 (three) times daily as needed for cough. 07/18/21  Yes Laurene Footman B, PA-C  doxycycline (VIBRAMYCIN) 100 MG capsule Take 1 capsule (100 mg total) by mouth 2 (two) times daily for 7 days. 07/18/21 07/25/21 Yes Laurene Footman B, PA-C  gabapentin (NEURONTIN) 100 MG capsule Take 2  capsules by mouth at bedtime. 06/11/16  Yes [provider]  HYDROcodone-acetaminophen (NORCO/VICODIN) 5-325 MG tablet 1 tab po qd prn Patient taking differently: Take 1 tablet by mouth daily as needed. 1 tab po qd prn 08/10/15  Yes Conty, [place], MD  ipratropium (ATROVENT) 0.06 % nasal spray Place 2 sprays into both nostrils 4 (four) times daily. 07/18/21  Yes Danton Clap, PA-C  predniSONE (DELTASONE) 20 MG tablet Take 1 tablet (20 mg total) by mouth daily for 5 days. 07/18/21 07/23/21 Yes Danton Clap, PA-C  XTAMPZA ER 9 MG C12A Take 1 capsule by mouth at bedtime. 06/07/21  Yes [provider]    Family History No family history on file.  Social History Social History   Tobacco Use   Smoking status: Never   Smokeless tobacco: Never  Vaping Use   Vaping Use: Never used  Substance Use Topics   Alcohol use: No   Drug use: No     Allergies   Patient has no known allergies.   Review of Systems Review of Systems  Constitutional:  Negative for chills, diaphoresis, fatigue and fever.  HENT:  Positive for congestion, rhinorrhea and sinus pressure. Negative for ear pain and sore throat.   Respiratory:  Positive for cough and wheezing. Negative for shortness of breath.   Cardiovascular:  Negative for chest pain.  Gastrointestinal:  Negative for  abdominal pain, nausea and vomiting.  Musculoskeletal:  Negative for myalgias.  Skin:  Negative for rash.  Neurological:  Negative for weakness and headaches.  Hematological:  Negative for adenopathy.    Physical Exam Triage Vital Signs ED Triage Vitals  Enc Vitals Group     BP 07/18/21 1248 (!) 207/73     Pulse Rate 07/18/21 1248 67     Resp 07/18/21 1248 18     Temp 07/18/21 1248 98.7 F (37.1 C)     Temp Source 07/18/21 1248 Oral     SpO2 07/18/21 1248 95 %     Weight 07/18/21 1246 125 lb (56.7 kg)     Height 07/18/21 1246 5\' 4"  (1.626 m)     Head Circumference --      Peak Flow --      Pain Score 07/18/21  1245 0     Pain Loc --      Pain Edu? --      Excl. in [place]? --    No data found.  Updated Vital Signs BP (!) 204/77 (BP Location: Left Arm)    Pulse 64    Temp 98.7 F (37.1 C) (Oral)    Resp 18    Ht 5\' 4"  (1.626 m)    Wt 125 lb (56.7 kg)    SpO2 95%    BMI 21.46 kg/m      Physical Exam Vitals and nursing note reviewed.  Constitutional:      General: She is not in acute distress.    Appearance: Normal appearance. She is ill-appearing. She is not toxic-appearing.  HENT:     Head: Normocephalic and atraumatic.     Nose: Congestion and rhinorrhea present.     Mouth/Throat:     Mouth: Mucous membranes are moist.     Pharynx: Oropharynx is clear.  Eyes:     General: No scleral icterus.       Right eye: No discharge.        Left eye: No discharge.     Conjunctiva/sclera: Conjunctivae normal.  Cardiovascular:     Rate and Rhythm: Normal rate and regular rhythm.     Heart sounds: Normal heart sounds.  Pulmonary:     Effort: Pulmonary effort is normal. No respiratory distress.     Breath sounds: Wheezing and rhonchi present.     Comments: Diffuse wheezing and rhonchi throughout all lung fields which  mostly clears with cough Musculoskeletal:     Cervical back: Neck supple.  Skin:    General: Skin is dry.  Neurological:     General: No focal deficit present.     Mental Status: She is alert. Mental status is at baseline.     Motor: No weakness.     Gait: Gait normal.  Psychiatric:        Mood and Affect: Mood normal.        Behavior: Behavior normal.        Thought Content: Thought content normal.     UC Treatments / Results  Labs (all labs ordered are listed, but only abnormal results are displayed) Labs Reviewed  SARS CORONAVIRUS 2 (TAT 6-24 HRS)    EKG   Radiology No results found.  Procedures Procedures (including critical care time)  Medications Ordered in UC Medications - No data to display  Initial Impression / Assessment and Plan / UC Course  I  have reviewed the triage vital signs and the nursing notes.  Pertinent labs & imaging results  that were available during my care of the patient were reviewed by me and considered in my medical decision making (see chart for details).  88 year old female presenting with her son for 4 to 5-day history of worsening cough and congestion beyond her chronic symptoms.  Also reports wheezing.  No shortness of breath.  No fever.  Patient's blood pressure is elevated at 207/73.  Recheck is 204/77.  Since that she has been compliant with her medicine.  Have advised making an appointment with PCP to discuss her very uncontrolled blood pressure.  Advise going to ED for signs of stroke or heart attack.  PCR COVID test obtained.  Current CDC guidelines, isolation protocol and ED precautions reviewed if positive.  Treating patient at this time for acute sinusitis and bronchitis.  We will treat patient with antibiotics given her age and presentation today.  Sent doxycycline, benzonatate and Atrovent nasal spray.  Originally sent prednisone by mistake.  This was called and canceled by medical assistant.  Advised if symptoms not getting better over the next week or if they were to worsen she needs to return or see PCP for reevaluation and imaging.  Patient and son agree.   Final Clinical Impressions(s) / UC Diagnoses   Final diagnoses:  Acute sinusitis, recurrence not specified, unspecified location  Acute bronchitis, unspecified organism  Acute cough  Uncontrolled hypertension     Discharge Instructions      -I have sent antibiotics to pharmacy for sinusitis.  Cough medication sent as well and a nasal spray. - COVID test performed.  Result we back tomorrow we will call if anything is positive.  If positive she should isolate 5 days wear mask 5 days. - Increase rest and fluids. -Meliza needs to be seen again if not feeling better by the time she finishes the antibiotic or for any worsening symptoms  including fever, worsening congestion/cough or breathing trouble.      ED Prescriptions     Medication Sig Dispense Auth. Provider   predniSONE (DELTASONE) 20 MG tablet Take 1 tablet (20 mg total) by mouth daily for 5 days. 5 tablet Laurene Footman B, PA-C   doxycycline (VIBRAMYCIN) 100 MG capsule Take 1 capsule (100 mg total) by mouth 2 (two) times daily for 7 days. 14 capsule Laurene Footman B, PA-C   benzonatate (TESSALON) 200 MG capsule Take 1 capsule (200 mg total) by mouth 3 (three) times daily as needed for cough. 30 capsule Laurene Footman B, PA-C   ipratropium (ATROVENT) 0.06 % nasal spray Place 2 sprays into both nostrils 4 (four) times daily. 15 mL Danton Clap, PA-C      PDMP not reviewed this encounter.   Danton Clap, PA-C 07/18/21 1450

## 2021-07-18 NOTE — ED Triage Notes (Signed)
Pt son states pt has been coughing "rattling", nasal congestion. Started about 4 days ago. He states she has chronic sinus problems.

## 2021-07-18 NOTE — Telephone Encounter (Signed)
Called to cancel the prednisone medication per provider request. Called into [facility] pharmacy and cancelled the patients medication.

## 2021-07-18 NOTE — Discharge Instructions (Signed)
-  I have sent antibiotics to pharmacy for sinusitis.  Cough medication sent as well and a nasal spray. - COVID test performed.  Result we back tomorrow we will call if anything is positive.  If positive she should isolate 5 days wear mask 5 days. - Increase rest and fluids. -Hannah Dillon needs to be seen again if not feeling better by the time she finishes the antibiotic or for any worsening symptoms including fever, worsening congestion/cough or breathing trouble.

## 2021-07-19 LAB — SARS CORONAVIRUS 2 (TAT 6-24 HRS): SARS Coronavirus 2: POSITIVE — AB

## 2021-07-27 ENCOUNTER — Encounter: Payer: Self-pay | Admitting: Internal Medicine

## 2021-07-27 ENCOUNTER — Emergency Department: Payer: Medicare Other

## 2021-07-27 ENCOUNTER — Inpatient Hospital Stay
Admission: EM | Admit: 2021-07-27 | Discharge: 2021-07-31 | DRG: 871 | Disposition: A | Discharge status: Home Health | Payer: Medicare Other | Attending: Internal Medicine | Admitting: Internal Medicine

## 2021-07-27 DIAGNOSIS — M5412 Radiculopathy, cervical region: Secondary | ICD-10-CM | POA: Diagnosis present

## 2021-07-27 DIAGNOSIS — J9600 Acute respiratory failure, unspecified whether with hypoxia or hypercapnia: Secondary | ICD-10-CM | POA: Diagnosis present

## 2021-07-27 DIAGNOSIS — J9601 Acute respiratory failure with hypoxia: Secondary | ICD-10-CM

## 2021-07-27 DIAGNOSIS — G894 Chronic pain syndrome: Secondary | ICD-10-CM | POA: Diagnosis present

## 2021-07-27 DIAGNOSIS — U071 COVID-19: Secondary | ICD-10-CM | POA: Diagnosis present

## 2021-07-27 DIAGNOSIS — A4189 Other specified sepsis: Principal | ICD-10-CM | POA: Diagnosis present

## 2021-07-27 DIAGNOSIS — F0390 Unspecified dementia without behavioral disturbance: Secondary | ICD-10-CM | POA: Diagnosis present

## 2021-07-27 DIAGNOSIS — F0392 Unspecified dementia, unspecified severity, with psychotic disturbance: Secondary | ICD-10-CM | POA: Diagnosis present

## 2021-07-27 DIAGNOSIS — I10 Essential (primary) hypertension: Secondary | ICD-10-CM | POA: Diagnosis present

## 2021-07-27 DIAGNOSIS — F0394 Unspecified dementia, unspecified severity, with anxiety: Secondary | ICD-10-CM | POA: Diagnosis present

## 2021-07-27 DIAGNOSIS — J9621 Acute and chronic respiratory failure with hypoxia: Secondary | ICD-10-CM | POA: Diagnosis present

## 2021-07-27 DIAGNOSIS — Z79899 Other long term (current) drug therapy: Secondary | ICD-10-CM

## 2021-07-27 DIAGNOSIS — M1990 Unspecified osteoarthritis, unspecified site: Secondary | ICD-10-CM | POA: Diagnosis present

## 2021-07-27 DIAGNOSIS — Z66 Do not resuscitate: Secondary | ICD-10-CM | POA: Diagnosis present

## 2021-07-27 DIAGNOSIS — A419 Sepsis, unspecified organism: Secondary | ICD-10-CM | POA: Diagnosis present

## 2021-07-27 DIAGNOSIS — R531 Weakness: Secondary | ICD-10-CM

## 2021-07-27 DIAGNOSIS — J189 Pneumonia, unspecified organism: Secondary | ICD-10-CM | POA: Diagnosis present

## 2021-07-27 DIAGNOSIS — G8929 Other chronic pain: Secondary | ICD-10-CM | POA: Diagnosis present

## 2021-07-27 HISTORY — DX: COVID-19: U07.1

## 2021-07-27 LAB — TROPONIN I (HIGH SENSITIVITY)
Troponin I (High Sensitivity): 10 ng/L (ref <18)
Troponin I (High Sensitivity): 10 ng/L (ref <18)

## 2021-07-27 LAB — PROTIME-INR
INR: 1.1 (ref 0.8–1.2)
Prothrombin Time: 14.2 seconds (ref 11.4–15.2)

## 2021-07-27 LAB — LACTIC ACID, PLASMA
Lactic Acid, Venous: 1.3 mmol/L (ref 0.5–1.9)
Lactic Acid, Venous: 1.9 mmol/L (ref 0.5–1.9)

## 2021-07-27 LAB — BASIC METABOLIC PANEL
Anion gap: 11 (ref 5–15)
BUN: 27 mg/dL — ABNORMAL HIGH (ref 8–23)
CO2: 26 mmol/L (ref 22–32)
Calcium: 8.9 mg/dL (ref 8.9–10.3)
Chloride: 97 mmol/L — ABNORMAL LOW (ref 98–111)
Creatinine, Ser: 0.74 mg/dL (ref 0.44–1.00)
GFR, Estimated: >60 mL/min (ref >60)
Glucose, Bld: 160 mg/dL — ABNORMAL HIGH (ref 70–99)
Potassium: 3.6 mmol/L (ref 3.5–5.1)
Sodium: 134 mmol/L — ABNORMAL LOW (ref 135–145)

## 2021-07-27 LAB — RESP PANEL BY RT-PCR (FLU A&B, COVID) ARPGX2
Influenza A by PCR: NEGATIVE
Influenza B by PCR: NEGATIVE
SARS Coronavirus 2 by RT PCR: POSITIVE — AB

## 2021-07-27 LAB — CBC
HCT: 35.6 % — ABNORMAL LOW (ref 36.0–46.0)
Hemoglobin: 12.1 g/dL (ref 12.0–15.0)
MCH: 32.4 pg (ref 26.0–34.0)
MCHC: 34 g/dL (ref 30.0–36.0)
MCV: 95.2 fL (ref 80.0–100.0)
Platelets: 377 10*3/uL (ref 150–400)
RBC: 3.74 MIL/uL — ABNORMAL LOW (ref 3.87–5.11)
RDW: 11.8 % (ref 11.5–15.5)
WBC: 26.2 10*3/uL — ABNORMAL HIGH (ref 4.0–10.5)
nRBC: 0 % (ref 0.0–0.2)

## 2021-07-27 LAB — APTT: aPTT: 33 seconds (ref 24–36)

## 2021-07-27 LAB — PROCALCITONIN: Procalcitonin: 0.24 ng/mL

## 2021-07-27 MED ORDER — SODIUM CHLORIDE 0.9 % IV BOLUS (SEPSIS)
1000.0000 mL | Freq: Once | INTRAVENOUS | Status: AC
  Administered 2021-07-27: 1000 mL via INTRAVENOUS

## 2021-07-27 MED ORDER — HYDROCODONE-ACETAMINOPHEN 5-325 MG PO TABS
1.0000 | ORAL_TABLET | Freq: Every day | ORAL | Status: DC | PRN
  Administered 2021-07-28: 16:00:00 1 via ORAL
  Filled 2021-07-27: qty 1

## 2021-07-27 MED ORDER — SODIUM CHLORIDE 0.9 % IV SOLN
500.0000 mg | INTRAVENOUS | Status: DC
  Administered 2021-07-27 – 2021-07-28 (×2): 500 mg via INTRAVENOUS
  Filled 2021-07-27 (×2): qty 5
  Filled 2021-07-27: qty 500

## 2021-07-27 MED ORDER — GABAPENTIN 100 MG PO CAPS
200.0000 mg | ORAL_CAPSULE | Freq: Every day | ORAL | Status: DC
  Administered 2021-07-28 – 2021-07-30 (×3): 200 mg via ORAL
  Filled 2021-07-27 (×3): qty 2

## 2021-07-27 MED ORDER — ACETAMINOPHEN 325 MG PO TABS
1000.0000 mg | ORAL_TABLET | Freq: Three times a day (TID) | ORAL | Status: DC | PRN
  Administered 2021-07-29: 975 mg via ORAL
  Filled 2021-07-27: qty 3

## 2021-07-27 MED ORDER — SODIUM CHLORIDE 0.9 % IV SOLN: 2.0000 g | INTRAVENOUS | Status: DC

## 2021-07-27 MED ORDER — ALBUTEROL SULFATE HFA 108 (90 BASE) MCG/ACT IN AERS
2.0000 | INHALATION_SPRAY | RESPIRATORY_TRACT | Status: DC | PRN
  Filled 2021-07-27: qty 6.7

## 2021-07-27 MED ORDER — OXYCODONE HCL ER 10 MG PO T12A
10.0000 mg | EXTENDED_RELEASE_TABLET | Freq: Two times a day (BID) | ORAL | Status: DC
  Administered 2021-07-28: 10:00:00 10 mg via ORAL
  Filled 2021-07-27: qty 1

## 2021-07-27 MED ORDER — SODIUM CHLORIDE 0.9 % IV SOLN
2.0000 g | INTRAVENOUS | Status: DC
  Administered 2021-07-27 – 2021-07-28 (×2): 2 g via INTRAVENOUS
  Filled 2021-07-27: qty 20
  Filled 2021-07-27: qty 2
  Filled 2021-07-27: qty 20

## 2021-07-27 MED ORDER — ENOXAPARIN SODIUM 40 MG/0.4ML IJ SOSY
40.0000 mg | PREFILLED_SYRINGE | INTRAMUSCULAR | Status: DC
  Administered 2021-07-27 – 2021-07-30 (×4): 40 mg via SUBCUTANEOUS
  Filled 2021-07-27 (×4): qty 0.4

## 2021-07-27 MED ORDER — ONDANSETRON HCL 4 MG/2ML IJ SOLN: 4.0000 mg | Freq: Four times a day (QID) | INTRAMUSCULAR | Status: DC | PRN

## 2021-07-27 MED ORDER — SODIUM CHLORIDE 0.9 % IV SOLN: 500.0000 mg | INTRAVENOUS | Status: DC

## 2021-07-27 MED ORDER — LACTATED RINGERS IV SOLN: INTRAVENOUS | Status: AC

## 2021-07-27 MED ORDER — ONDANSETRON HCL 4 MG PO TABS: 4.0000 mg | ORAL_TABLET | Freq: Four times a day (QID) | ORAL | Status: DC | PRN

## 2021-07-27 MED ORDER — LACTATED RINGERS IV SOLN: INTRAVENOUS | Status: DC

## 2021-07-27 NOTE — ED Notes (Signed)
Pt brought in by son from home with a cough and weakness for several day.  Pt is covid positive per son.     Pt is on 2 liters oxygen [place].  2 iv's infusing.    Family at bedside.

## 2021-07-27 NOTE — ED Triage Notes (Signed)
Pt to ED via POV from home with son. Son states pt has been having increased weakness and lethargy over the last several days and reports failure to thrive. Pt RA sats 85%. Pt placed on 2L [place]. Pt recently diagnosed with FLU A. Pt only endorses chronic pain in her low back.

## 2021-07-27 NOTE — Progress Notes (Signed)
CODE SEPSIS - PHARMACY COMMUNICATION  **Broad Spectrum Antibiotics should be administered within 1 hour of Sepsis diagnosis**  Time Code Sepsis Called/Page Received: 1802  Antibiotics Ordered: ceftriaxone and azithromycin  Time of 1st antibiotic administration: 1838  Additional action taken by pharmacy: none required  If necessary, Name of Provider/Nurse Contacted: N/A    Hannah Dillon ,PharmD Clinical Pharmacist  07/27/2021  6:05 PM

## 2021-07-27 NOTE — Progress Notes (Signed)
Elink following code sepsis °

## 2021-07-27 NOTE — H&P (Addendum)
History and Physical    Hannah Dillon [id number] DOB: August 10, 1922 DOA: 07/27/2021  PCP: System, Provider Not In   Patient coming from: Home  I have personally briefly reviewed patient's old medical records in [place]  Chief Complaint: Weakness/Lethargy  Most of the history was obtained from patient's son at the bedside.  HPI: Hannah Dillon is a 88 y.o. female with medical history significant for cervical radiculopathy, degenerative joint disease, recent COVID-19 infection (diagnosed on 07/18/21) who was brought into the ER by her son for evaluation of weakness and increased lethargy over the last couple of days. He states that patient was seen at the urgent care center for evaluation of a cough and nasal congestion and at that time her COVID-19 PCR test was positive.  She was treated with doxycycline, benzonatate and Atrovent nasal spray.  She was not prescribed any antiviral because the son states that they were told she was outside the window for treatment. Despite completing her prescribed medications her symptoms have continued to worsen and she notes that she has poor oral intake, she is very weak and has become increasingly lethargic.  She has not had any falls but has been unable to get around like she normally would. In the ER she was noted to be hypoxic with room air pulse oximetry of 85% and was placed on 2 L of oxygen to improve her pulse oximetry to 92%. She complains of pain in her right shoulder as well as her neck but denies having any abdominal pain, no nausea, no vomiting, no diarrhea, no urinary symptoms, no dizziness or lightheadedness, no palpitations, no diaphoresis or focal deficit. Sodium 134, potassium 3.6, chloride 97, bicarb 26, glucose 160, BUN 27, creatinine 0.74, calcium 8.9, troponin 10, procalcitonin 0.24, white count 26.2, hemoglobin 12.1, hematocrit 35.6, MCV 95.2, RDW 11.8, platelet count 377 Chest x-ray reviewed by me shows a right lower lobe  pneumonia with a small right pleural effusion. Twelve-lead EKG reviewed by me shows Normal sinus rhythm with low voltage QRS.  ED Course: Patient is a 88 year old unvaccinated female who presents to the ER for evaluation of weakness and increased lethargy. Her COVID-19 PCR test was initially positive on 07/18/21 and patient was treated with doxycycline, benzonatate and Atrovent inhaler with no improvement in her symptoms. Noted to have room air pulse oximetry of 85% and is currently on 2 L of oxygen.  Chest x-ray shows a right lower lobe pneumonia with marked leukocytosis of 26,000. She received a dose of Rocephin and Zithromax in the ER and will be admitted to the hospital for further evaluation.    Review of Systems: As per HPI otherwise all other systems reviewed and negative.    Past Medical History:  Diagnosis Date   Arthritis    Cervical radiculopathy    DJD (degenerative joint disease)    Left arm pain     History reviewed. No pertinent surgical history.   reports that she has never smoked. She has never used smokeless tobacco. She reports that she does not drink alcohol and does not use drugs.  No Known Allergies  History reviewed. No pertinent family history.  Family members are deceased  Prior to Admission medications   Medication Sig Start Date End Date Taking? Authorizing Provider  acetaminophen (TYLENOL) 650 MG CR tablet Take 1,300 mg by mouth every 8 (eight) hours as needed for pain.    [provider]  benzonatate (TESSALON) 200 MG capsule Take 1 capsule (200 mg total) by mouth  3 (three) times daily as needed for cough. 07/18/21   Laurene Footman B, PA-C  gabapentin (NEURONTIN) 100 MG capsule Take 2 capsules by mouth at bedtime. 06/11/16   [provider]  HYDROcodone-acetaminophen (NORCO/VICODIN) 5-325 MG tablet 1 tab po qd prn Patient taking differently: Take 1 tablet by mouth daily as needed. 1 tab po qd prn 08/10/15   Norval Gable, MD   ipratropium (ATROVENT) 0.06 % nasal spray Place 2 sprays into both nostrils 4 (four) times daily. 07/18/21   Danton Clap, PA-C  XTAMPZA ER 9 MG C12A Take 1 capsule by mouth at bedtime. 06/07/21   [provider]    Physical Exam: Vitals:   07/27/21 1544 07/27/21 1551 07/27/21 1809  BP: 134/64  (!) 152/74  Pulse: 96  63  Resp: 18  17  Temp: 98.8 F (37.1 C)    TempSrc: Axillary    SpO2: (!) 85% (!) 85% 95%     Vitals:   07/27/21 1544 07/27/21 1551 07/27/21 1809  BP: 134/64  (!) 152/74  Pulse: 96  63  Resp: 18  17  Temp: 98.8 F (37.1 C)    TempSrc: Axillary    SpO2: (!) 85% (!) 85% 95%      Constitutional: Alert and oriented x 2 . Not in any apparent distress HEENT:      Head: Normocephalic and atraumatic.         Eyes: PERLA, EOMI, Conjunctivae are normal. Sclera is non-icteric.       Mouth/Throat: Mucous membranes are dry.       Neck: Supple with no signs of meningismus. Cardiovascular: Regular rate and rhythm. No murmurs, gallops, or rubs. 2+ symmetrical distal pulses are present . No JVD. No LE edema Respiratory: Respiratory effort normal .rhonchi in the right lung base.  Left lung is clear . No wheezes or rhonchi.  Gastrointestinal: Soft, non tender, and non distended with positive bowel sounds.  Genitourinary: No CVA tenderness. Musculoskeletal: Nontender with normal range of motion in all extremities. No cyanosis, or erythema of extremities. Neurologic:  Face is symmetric. Moving all extremities. No gross focal neurologic deficits.  Generalized weakness Skin: Skin is warm, dry.  No rash or ulcers Psychiatric: Mood and affect are normal    Labs on Admission: I have personally reviewed following labs and imaging studies  CBC: Recent Labs  Lab 07/27/21 1555  WBC 26.2*  HGB 12.1  HCT 35.6*  MCV 95.2  PLT [id number]   Basic Metabolic Panel: Recent Labs  Lab 07/27/21 1555  NA 134*  K 3.6  CL 97*  CO2 26  GLUCOSE 160*  BUN 27*  CREATININE 0.74   CALCIUM 8.9   GFR: Estimated Creatinine Clearance: 35.5 mL/min (by C-G formula based on SCr of 0.74 mg/dL). Liver Function Tests: No results for input(s): AST, ALT, ALKPHOS, BILITOT, PROT, ALBUMIN in the last 168 hours. No results for input(s): LIPASE, AMYLASE in the last 168 hours. No results for input(s): AMMONIA in the last 168 hours. Coagulation Profile: Recent Labs  Lab 07/27/21 1833  INR 1.1   Cardiac Enzymes: No results for input(s): CKTOTAL, CKMB, CKMBINDEX, TROPONINI in the last 168 hours. BNP (last 3 results) No results for input(s): PROBNP in the last 8760 hours. HbA1C: No results for input(s): HGBA1C in the last 72 hours. CBG: No results for input(s): GLUCAP in the last 168 hours. Lipid Profile: No results for input(s): CHOL, HDL, LDLCALC, TRIG, CHOLHDL, LDLDIRECT in the last 72 hours. Thyroid Function Tests: No  results for input(s): TSH, T4TOTAL, FREET4, T3FREE, THYROIDAB in the last 72 hours. Anemia Panel: No results for input(s): VITAMINB12, FOLATE, FERRITIN, TIBC, IRON, RETICCTPCT in the last 72 hours. Urine analysis:    Component Value Date/Time   COLORURINE YELLOW (A) 07/22/2016 0847   APPEARANCEUR HAZY (A) 07/22/2016 0847   APPEARANCEUR Clear 10/10/2014 0919   LABSPEC 1.010 07/22/2016 0847   LABSPEC 1.017 10/10/2014 0919   PHURINE 9.0 (H) 07/22/2016 0847   GLUCOSEU NEGATIVE 07/22/2016 0847   GLUCOSEU Negative 10/10/2014 0919   HGBUR NEGATIVE 07/22/2016 0847   BILIRUBINUR NEGATIVE 07/22/2016 0847   BILIRUBINUR Negative 10/10/2014 0919   KETONESUR NEGATIVE 07/22/2016 0847   PROTEINUR NEGATIVE 07/22/2016 0847   NITRITE POSITIVE (A) 07/22/2016 0847   LEUKOCYTESUR LARGE (A) 07/22/2016 0847   LEUKOCYTESUR Negative 10/10/2014 0919    Radiological Exams on Admission: DG Chest 2 View  Result Date: 07/27/2021 CLINICAL DATA:  Shortness of breath EXAM: CHEST - 2 VIEW COMPARISON:  12/09/2019 FINDINGS: Stable mild cardiomegaly. Aortic atherosclerosis. Patchy  airspace consolidation within the right lower lobe. Small right pleural effusion. Minimal atelectasis in the left lung base. Left lung is otherwise clear. No pneumothorax. IMPRESSION: Right lower lobe pneumonia with small right pleural effusion. Electronically Signed   By: Davina Poke D.O.   On: 07/27/2021 16:30     Assessment/Plan Principal Problem:   Acute respiratory failure (HCC) Active Problems:   CAP (community acquired pneumonia)   COVID-19 virus infection   Arthritis   Cervical radiculopathy      Patient is an 88 year old female admitted to the hospital for pneumonia with acute respiratory failure.    Acute respiratory failure/community-acquired pneumonia/recent COVID-19 viral infection Patient noted to have right lower lobe infiltrate with marked leukocytosis. Was hypoxic with room air pulse oximetry of 85% and is currently on 2 L of oxygen She is afebrile and is not tachycardic.  Blood pressure is stable. Lactic acid is pending at the time of this H&P. Will treat patient empirically with IV Rocephin and Zithromax Follow-up results of blood cultures Will obtain cycle threshold for her COVID-19 PCR and if less than 32 we will treat patient for active COVID-19 viral illness. Patient is unvaccinated against the COVID 19 virus Speech therapy consult for swallow function evaluation Continue oxygen supplementation to maintain pulse oximetry greater than 92%.    Chronic pain syndrome From cervical radiculopathy and degenerative joint disease Continue scheduled acetaminophen, Norco and oxycodone  DVT prophylaxis: Lovenox  Code Status: DO NOT RESUSCITATE Family Communication: Greater than 50% of time was spent discussing patient's condition and plan of care with her son at the bedside.  All questions and concerns have been addressed.  He verbalizes understanding and agree with the plan Disposition Plan: Back to previous home environment Consults called: none  Status:At  the time of admission, it appears that the appropriate admission status for this patient is inpatient. This is judged to be reasonable and necessary to provide the required intensity of service to ensure the patient's safety given the presenting symptoms, physical exam findings, and initial radiographic and laboratory data in the context of their comorbid conditions. Patient requires inpatient status due to high intensity of service, high risk for further deterioration and high frequency of surveillance required.     Collier Bullock MD [facility]     07/27/2021, 7:10 PM

## 2021-07-27 NOTE — ED Provider Notes (Signed)
[facility] Provider Note    Event Date/Time   First MD Initiated Contact with Patient 07/27/21 1758     (approximate)  History   Chief Complaint: Weakness  HPI  Hannah Dillon is a 88 y.o. female with a past medical history of dementia who presents to the emergency department for worsening cough, weakness and lethargy.  Upon arrival patient satting 85% on room air with no baseline O2 requirement.  Physical Exam   Triage Vital Signs: ED Triage Vitals [07/27/21 1544]  Enc Vitals Group     BP 134/64     Pulse Rate 96     Resp 18     Temp 98.8 F (37.1 C)     Temp Source Axillary     SpO2 (!) 85 %     Weight      Height      Head Circumference      Peak Flow      Pain Score      Pain Loc      Pain Edu?      Excl. in GC?     Most recent vital signs: Vitals:   07/27/21 1544 07/27/21 1551  BP: 134/64   Pulse: 96   Resp: 18   Temp: 98.8 F (37.1 C)   SpO2: (!) 85% (!) 85%    General: Patient is awake alert.  No acute distress.  Frequent wet sounding cough. CV:  Good peripheral perfusion.  Regular rate and rhythm around 90 bpm. Resp:  Normal effort.  Equal breath sounds bilaterally.  Significant rhonchi auscultated in the right lung fields.  Occasional wet cough during exam. Abd:  No distention.  Soft, nontender.  No rebound or guarding. Other:  No lower extremity edema.   ED Results / Procedures / Treatments   EKG  EKG viewed and interpreted by myself shows a normal sinus rhythm 85 bpm with a narrow QRS, normal axis, normal intervals, nonspecific but no concerning ST changes.  RADIOLOGY  I personally reviewed the chest x-ray images appears to have right lower lobe opacity. Radiology has read the x-ray as right lower lobe pneumonia.  MEDICATIONS ORDERED IN ED: Medications  lactated ringers infusion (has no administration in time range)  sodium chloride 0.9 % bolus 1,000 mL (has no administration in time range)  cefTRIAXone  (ROCEPHIN) 2 g in sodium chloride 0.9 % 100 mL IVPB (has no administration in time range)  azithromycin (ZITHROMAX) 500 mg in sodium chloride 0.9 % 250 mL IVPB (has no administration in time range)     IMPRESSION / MDM / ASSESSMENT AND PLAN / ED COURSE  I reviewed the triage vital signs and the nursing notes.  Patient presents emergency department from home with son for worsening lethargy weakness wet sounding cough.  Upon arrival patient noted to be hypoxic to 85%.  Patient's lab work shows an elevated white blood cell count of 26,000.  Remainder the lab work is reassuring including a negative troponin and normal renal function.  Procalcitonin is slightly elevated at 0.24.  I reviewed the patient's records she tested positive for COVID 07/18/2021.  Looking at the patient's x-ray she appears to have a lobar pneumonia on the right lower lobe.  Concern for possible bacterial pneumonia secondary to her initial viral infection.  We will start the patient on IV antibiotics.  Patient placed on 2 L nasal cannula satting in the mid 90s.  Given the patient's elevated white blood cell count and hypoxia meeting  sepsis criteria we will admit to the hospitalist service for continued work-up and treatment.  I discussed the plan of care with the son who is agreeable to admission.  I spoke to the hospitalist who will be admitting the patient to their service for further work-up and treatment.  CRITICAL CARE Performed by: Minna Antis   Total critical care time: 30 minutes  Critical care time was exclusive of separately billable procedures and treating other patients.  Critical care was necessary to treat or prevent imminent or life-threatening deterioration.  Critical care was time spent personally by me on the following activities: development of treatment plan with patient and/or surrogate as well as nursing, discussions with consultants, evaluation of patient's response to treatment, examination of  patient, obtaining history from patient or surrogate, ordering and performing treatments and interventions, ordering and review of laboratory studies, ordering and review of radiographic studies, pulse oximetry and re-evaluation of patient's condition.   FINAL CLINICAL IMPRESSION(S) / ED DIAGNOSES   Pneumonia Sepsis    Note:  This document was prepared using Dragon voice recognition software and may include unintentional dictation errors.   Minna Antis, MD 07/27/21 [phone]

## 2021-07-28 ENCOUNTER — Other Ambulatory Visit: Payer: Self-pay

## 2021-07-28 DIAGNOSIS — J9601 Acute respiratory failure with hypoxia: Secondary | ICD-10-CM | POA: Diagnosis not present

## 2021-07-28 LAB — BASIC METABOLIC PANEL
Anion gap: 11 (ref 5–15)
BUN: 19 mg/dL (ref 8–23)
CO2: 26 mmol/L (ref 22–32)
Calcium: 8.6 mg/dL — ABNORMAL LOW (ref 8.9–10.3)
Chloride: 99 mmol/L (ref 98–111)
Creatinine, Ser: 0.57 mg/dL (ref 0.44–1.00)
GFR, Estimated: >60 mL/min (ref >60)
Glucose, Bld: 136 mg/dL — ABNORMAL HIGH (ref 70–99)
Potassium: 3.4 mmol/L — ABNORMAL LOW (ref 3.5–5.1)
Sodium: 136 mmol/L (ref 135–145)

## 2021-07-28 LAB — CBC
HCT: 33.7 % — ABNORMAL LOW (ref 36.0–46.0)
Hemoglobin: 11.3 g/dL — ABNORMAL LOW (ref 12.0–15.0)
MCH: 31.7 pg (ref 26.0–34.0)
MCHC: 33.5 g/dL (ref 30.0–36.0)
MCV: 94.4 fL (ref 80.0–100.0)
Platelets: 375 10*3/uL (ref 150–400)
RBC: 3.57 MIL/uL — ABNORMAL LOW (ref 3.87–5.11)
RDW: 11.7 % (ref 11.5–15.5)
WBC: 28.5 10*3/uL — ABNORMAL HIGH (ref 4.0–10.5)
nRBC: 0 % (ref 0.0–0.2)

## 2021-07-28 LAB — MRSA NEXT GEN BY PCR, NASAL: MRSA by PCR Next Gen: NOT DETECTED

## 2021-07-28 MED ORDER — LABETALOL HCL 5 MG/ML IV SOLN
10.0000 mg | INTRAVENOUS | Status: DC | PRN
  Administered 2021-07-29 (×2): 10 mg via INTRAVENOUS
  Filled 2021-07-28 (×2): qty 4

## 2021-07-28 MED ORDER — MORPHINE SULFATE (PF) 2 MG/ML IV SOLN
2.0000 mg | Freq: Once | INTRAVENOUS | Status: AC
  Administered 2021-07-28: 06:00:00 2 mg via INTRAVENOUS
  Filled 2021-07-28: qty 1

## 2021-07-28 MED ORDER — ALPRAZOLAM 0.5 MG PO TABS
0.5000 mg | ORAL_TABLET | Freq: Three times a day (TID) | ORAL | Status: DC | PRN
  Administered 2021-07-28 – 2021-07-30 (×3): 0.5 mg via ORAL
  Filled 2021-07-28 (×3): qty 1

## 2021-07-28 MED ORDER — IPRATROPIUM-ALBUTEROL 0.5-2.5 (3) MG/3ML IN SOLN
3.0000 mL | Freq: Four times a day (QID) | RESPIRATORY_TRACT | Status: DC
  Administered 2021-07-28 – 2021-07-29 (×4): 3 mL via RESPIRATORY_TRACT
  Filled 2021-07-28 (×3): qty 3

## 2021-07-28 MED ORDER — OXYCODONE HCL ER 10 MG PO T12A
10.0000 mg | EXTENDED_RELEASE_TABLET | Freq: Every day | ORAL | Status: DC
  Administered 2021-07-28 – 2021-07-30 (×3): 10 mg via ORAL
  Filled 2021-07-28 (×3): qty 1

## 2021-07-28 MED ORDER — POTASSIUM CHLORIDE 20 MEQ PO PACK
60.0000 meq | PACK | Freq: Once | ORAL | Status: AC
  Administered 2021-07-28: 60 meq via ORAL
  Filled 2021-07-28: qty 3

## 2021-07-28 NOTE — Plan of Care (Signed)
°  Problem: Activity: Goal: Ability to tolerate increased activity will improve Outcome: Progressing   Problem: Clinical Measurements: Goal: Ability to maintain a body temperature in the normal range will improve Outcome: Progressing   Problem: Respiratory: Goal: Ability to maintain adequate ventilation will improve Outcome: Progressing Goal: Ability to maintain a clear airway will improve Outcome: Progressing   Problem: Education: Goal: Knowledge of General Education information will improve Description: Including pain rating scale, medication(s)/side effects and non-pharmacologic comfort measures Outcome: Progressing   Problem: Health Behavior/Discharge Planning: Goal: Ability to manage health-related needs will improve Outcome: Progressing   Problem: Clinical Measurements: Goal: Ability to maintain clinical measurements within normal limits will improve Outcome: Progressing Goal: Will remain free from infection Outcome: Progressing Goal: Diagnostic test results will improve Outcome: Progressing Goal: Respiratory complications will improve Outcome: Progressing Goal: Cardiovascular complication will be avoided Outcome: Progressing   Problem: Activity: Goal: Risk for activity intolerance will decrease Outcome: Progressing   Problem: Nutrition: Goal: Adequate nutrition will be maintained Outcome: Progressing   Problem: Coping: Goal: Level of anxiety will decrease Outcome: Progressing   Problem: Elimination: Goal: Will not experience complications related to bowel motility Outcome: Progressing Goal: Will not experience complications related to urinary retention Outcome: Progressing   Problem: Pain Managment: Goal: General experience of comfort will improve Outcome: Progressing   Problem: Safety: Goal: Ability to remain free from injury will improve Outcome: Progressing   Problem: Skin Integrity: Goal: Risk for impaired skin integrity will decrease Outcome:  Progressing   Problem: Activity: Goal: Ability to tolerate increased activity will improve Outcome: Progressing   Problem: Clinical Measurements: Goal: Ability to maintain a body temperature in the normal range will improve Outcome: Progressing   Problem: Respiratory: Goal: Ability to maintain adequate ventilation will improve Outcome: Progressing Goal: Ability to maintain a clear airway will improve Outcome: Progressing   

## 2021-07-28 NOTE — Evaluation (Signed)
Occupational Therapy Evaluation Patient Details Name: Eleanore Junio MRN: [id number] DOB: 1923/06/26 Today's Date: 07/28/2021   History of Present Illness 88 y.o. female with medical history significant for cervical radiculopathy, degenerative joint disease, recent COVID-19 infection (diagnosed on 07/18/21) who was brought into the ER by her son for evaluation of weakness and increased lethargy over the last couple of days.  He states that patient was seen at the urgent care center for evaluation of a cough and nasal congestion and at that time her COVID-19 PCR test was positive.   Clinical Impression   Patient presenting with decreased Ind in self care, balance, functional mobility/transfers, endurance, and safety awareness. Patient's son present to confirm baseline. Pt lives with son and pt ambulates without use of AD at baseline. She has had several falls over the last few months. She normally does sink bathing but son reports pt has increased paranoia at home and has not been very thorough with hygiene. Pt very confused and believes [place] to be in church and needing constant redirection and encouragement. She is pleasant and follows commands with increased time. Attempted placing pt on RA but drops under 90% with coughing and placed back on 1L via [place]. Pt stands with min A holding onto therapists hand. Pt ambulates in room with min guard. She returns to sitting on EOB and combs hair with set up A and cuing to initiate. OT recommends pt utilize RW at home for safety as well. Patient will benefit from acute OT to increase overall independence in the areas of ADLs, functional mobility, and safety awareness in order to safely discharge home with family.     Recommendations for follow up therapy are one component of a multi-disciplinary discharge planning process, led by the attending physician.  Recommendations may be updated based on patient status, additional functional criteria and insurance  authorization.   Follow Up Recommendations  Home health OT    Assistance Recommended at Discharge Frequent or constant Supervision/Assistance  Patient can return home with the following A little help with walking and/or transfers;A little help with bathing/dressing/bathroom;Assistance with cooking/housework;Direct supervision/assist for medications management;Direct supervision/assist for financial management;Help with stairs or ramp for entrance    Functional Status Assessment  Patient has had a recent decline in their functional status and demonstrates the ability to make significant improvements in function in a reasonable and predictable amount of time.  Equipment Recommendations  BSC/3in1       Precautions / Restrictions Precautions Precautions: Fall      Mobility Bed Mobility Overal bed mobility: Needs Assistance Bed Mobility: Supine to Sit;Sit to Supine     Supine to sit: Min assist Sit to supine: Min assist   General bed mobility comments: trunk support and cuing for technique    Transfers Overall transfer level: Needs assistance Equipment used: 1 person hand held assist Transfers: Sit to/from Stand Sit to Stand: Min assist           General transfer comment: min HHA secondary to pt reporting being scared and she is confused. Min guard for ambulation in the room.      Balance Overall balance assessment: Needs assistance Sitting-balance support: Single extremity supported;Feet supported Sitting balance-Leahy Scale: Good     Standing balance support: During functional activity Standing balance-Leahy Scale: Fair                             ADL either performed or assessed with clinical judgement  ADL Overall ADL's : Needs assistance/impaired     Grooming: Wash/dry hands;Wash/dry face;Sitting;Supervision/safety;Set up                   Toilet Transfer: Minimal assistance;Ambulation Toilet Transfer Details (indicate cue type and  reason): simulated         Functional mobility during ADLs: Min guard;Minimal assistance       Vision Patient Visual Report: No change from baseline              Pertinent Vitals/Pain Pain Assessment: Faces Faces Pain Scale: No hurt     Hand Dominance Right   Extremity/Trunk Assessment Upper Extremity Assessment Upper Extremity Assessment: Generalized weakness   Lower Extremity Assessment Lower Extremity Assessment: Generalized weakness       Communication Communication Communication: No difficulties   Cognition Arousal/Alertness: Awake/alert Behavior During Therapy: Restless;Impulsive Overall Cognitive Status: History of cognitive impairments - at baseline                                 General Comments: Per son, pt is not oriented to time at baseline and has more confusion at this time. Pt believes we are in church during this session.                Home Living Family/patient expects to be discharged to:: Private residence Living Arrangements: Children Available Help at Discharge: Family;Available 24 hours/day Type of Home: House Home Access: Stairs to enter [employer] of Steps: 2-3   Home Layout: One level               Home Equipment: Agricultural consultant (2 wheels)          Prior Functioning/Environment Prior Level of Function : Independent/Modified Independent             Mobility Comments: Pt has RW but does not use at baseline for functional mobility. Son reports she has had 3 falls in 6 months. ADLs Comments: Pt performs sink bathing but has been coming more paranoid and not doing "a great job" per son. Son assists with medication , meals, and taking pt to appointments.        OT Problem List: Decreased strength;Decreased activity tolerance;Impaired balance (sitting and/or standing);Decreased coordination;Decreased cognition;Decreased safety awareness;Decreased knowledge of use of DME or AE;Decreased  knowledge of precautions      OT Treatment/Interventions: Self-care/ADL training;Therapeutic exercise;Therapeutic activities;Energy conservation;DME and/or AE instruction;Patient/family education;Manual therapy;Balance training    OT Goals(Current goals can be found in the care plan section) Acute Rehab OT Goals Patient Stated Goal: to get stronger OT Goal Formulation: With patient/family Time For Goal Achievement: 08/11/21 Potential to Achieve Goals: Good ADL Goals Pt Will Perform Grooming: with supervision Pt Will Perform Lower Body Dressing: with supervision Pt Will Transfer to Toilet: with supervision Pt Will Perform Toileting - Clothing Manipulation and hygiene: with supervision  OT Frequency: Min 2X/week       AM-PAC OT "6 Clicks" Daily Activity     Outcome Measure Help from another person eating meals?: None Help from another person taking care of personal grooming?: None Help from another person toileting, which includes using toliet, bedpan, or urinal?: A Little Help from another person bathing (including washing, rinsing, drying)?: A Little Help from another person to put on and taking off regular upper body clothing?: None Help from another person to put on and taking off regular lower body clothing?: A Little 6 Click  Score: 21   End of Session Equipment Utilized During Treatment: Oxygen (1L) Nurse Communication: Mobility status  Activity Tolerance: Patient tolerated treatment well Patient left: in bed;with call bell/phone within reach;with bed alarm set;with family/visitor present;with nursing/sitter in room  OT Visit Diagnosis: Unsteadiness on feet (R26.81);Repeated falls (R29.6);Muscle weakness (generalized) (M62.81);History of falling (Z91.81)                Time: [id number] OT Time Calculation (min): 33 min Charges:  OT General Charges $OT Visit: 1 Visit OT Evaluation $OT Eval Moderate Complexity: 1 Mod OT Treatments $Self Care/Home Management : 8-22  mins $Therapeutic Activity: 8-22 mins  Jackquline DenmarkKatie Andree Heeg, MS, OTR/L , CBIS ascom [phone]  07/28/21, 12:55 PM

## 2021-07-28 NOTE — Progress Notes (Addendum)
Progress Note:    Hannah Dillon    RPZ:[id number] DOB: 1922/07/30 DOA: 07/27/2021  PCP: System, Provider Not In    Brief Narrative:   88 year old Caucasian female with a past medical history significant for dementia, cervical radiculopathy, DJD, recent COVID-19 infection (diagnosed on 07/18/2021).  This patient was brought in to the ER by her son for evaluation for weakness and increased lethargy over the past several days.  Of note the patient was seen at local urgent care for evaluation of a cough and nasal congestion and at that time her COVID-19 PCR test was positive.  She was treated with doxycycline, Tessalon Perles and Atrovent nasal spray.  Instead she feels very weak and has become increasingly lethargic.  Found to be saturating 85% on room air in the emergency department.  Admitted for acute hypoxic respiratory failure due to community-acquired pneumonia and COVID-19 infection.    Subjective:   Seen this morning with son present at bedside.  OT evaluating the patient currently.  Titrated the patient down to 1 L nasal cannula.  She feels very weak.  Denies any chest pain.  She lives with her son.  Can tell me her name and where she is.  Cannot tell me the date.  Son states that she does have some underlying dementia and cannot normally tell the date.  But states that she seems a little more confused than usual.   Assessment and Plan:   Acute hypoxic respiratory failure secondary to community acquired pneumonia and COVID-19 infection: Continue isolation precautions can discontinue tomorrow.  Chest x-ray showed right lower lobe pneumonia.  Continue Rocephin and Zithromax.  Weaned down to 1 L nasal cannula.  Start scheduled bronchodilators with DuoNebs.  She met hypoxic respiratory failure criteria with pulse ox of 85% on room air in the emergency department.   Sepsis: 2/2 above. UA/UCx pending. Blood cultures negative to date. Has a significant leukocytosis and if continues to  trend up may need to broaden antibiotics to cover for MRSA pneumonia. Will order a MRSA nares. Unable to obtain sputum culture as the patient is not able to produce sputum sample due to dry cough.  Generalized weakness: due to the above. PT/OT consulted to see if patient needs HHPT/OT vs SNF rehab upon discharge.  Chronic pain: From cervical radiculopathy and DJD.  Continue home gabapentin.  On pain scale.  Holding home Norco and Castella ER.    Other information:    DVT prophylaxis: Lovenox subcu Code Status: DNR Family Communication: Son was present at bedside upon my evaluation Disposition:   Status is: Inpatient  Remains inpatient appropriate because: Still hypoxic and septic with significant leukocytosi     Consultants:   None    Objective:    Vitals:   07/28/21 0437 07/28/21 0722 07/28/21 1031 07/28/21 1140  BP: (!) 190/81 (!) 153/95 (!) 153/95 (!) 185/72  Pulse: 97 99 99 81  Resp: _0 Temp: 98 F (36.7 C) 98.4 F (36.9 C) 98.4 F (36.9 C) 98.9 F (37.2 C)  TempSrc: Oral Oral Oral Oral  SpO2: 93% 93%  (!) 86%  Weight:   56.7 kg   Height:   5' 4.02" (1.626 m)     Intake/Output Summary (Last 24 hours) at 07/28/2021 1246 Last data filed at 07/28/2021 0300 Gross per 24 hour  Intake 725.77 ml  Output --  Net 725.77 ml   Filed Weights   07/28/21 1031  Weight: 56.7 kg  Physical Exam:    General exam: Appears calm and comfortable  Respiratory system: Right lower lobe decreased breath sounds Cardiovascular system: S1 & S2 heard, RRR. No JVD, murmurs, rubs, gallops or clicks. No pedal edema. Gastrointestinal system: Abdomen is nondistended, soft and nontender. No organomegaly or masses felt. Normal bowel sounds heard. Central nervous system: Alert and oriented x2 to name with intermittent confusion. No focal neurological deficits. Extremities: Symmetric 5 x 5 power.  Generalized weakness. Skin: No rashes, lesions or ulcers Psychiatry:  Judgement and insight appear normal. Mood & affect appropriate.     Data Reviewed:    I have personally reviewed following labs and imaging studies  CBC: Recent Labs  Lab 07/27/21 1555 07/28/21 0653  WBC 26.2* 28.5*  HGB 12.1 11.3*  HCT 35.6* 33.7*  MCV 95.2 94.4  PLT 377 242    Basic Metabolic Panel: Recent Labs  Lab 07/27/21 1555 07/28/21 0653  NA 134* 136  K 3.6 3.4*  CL 97* 99  CO2 26 26  GLUCOSE 160* 136*  BUN 27* 19  CREATININE 0.74 0.57  CALCIUM 8.9 8.6*    GFR: Estimated Creatinine Clearance: 35.5 mL/min (by C-G formula based on SCr of 0.57 mg/dL).  Liver Function Tests: No results for input(s): AST, ALT, ALKPHOS, BILITOT, PROT, ALBUMIN in the last 168 hours.  CBG: No results for input(s): GLUCAP in the last 168 hours.   Recent Results (from the past 240 hour(s))  SARS CORONAVIRUS 2 (TAT 6-24 HRS) Nasopharyngeal Nasopharyngeal Swab     Status: Abnormal   Collection Time: 07/18/21  1:57 PM   Specimen: Nasopharyngeal Swab  Result Value Ref Range Status   SARS Coronavirus 2 POSITIVE (A) NEGATIVE Final    Comment: (NOTE) SARS-CoV-2 target nucleic acids are DETECTED.  The SARS-CoV-2 RNA is generally detectable in upper and lower respiratory specimens during the acute phase of infection. Positive results are indicative of the presence of SARS-CoV-2 RNA. Clinical correlation with patient history and other diagnostic information is  necessary to determine patient infection status. Positive results do not rule out bacterial infection or co-infection with other viruses.  The expected result is Negative.  Fact Sheet for Patients: SugarRoll.be  Fact Sheet for Healthcare Providers: https://www.woods-mathews.com/  This test is not yet approved or cleared by the [place] FDA and  has been authorized for detection and/or diagnosis of SARS-CoV-2 by FDA under an Emergency Use Authorization (EUA). This EUA will  remain  in effect (meaning this test can be used) for the duration of the COVID-19 declaration under Section 564(b)(1) of the Act, 21 U. S.C. section 360bbb-3(b)(1), unless the authorization is terminated or revoked sooner.   Performed at [facility], [place] [address], [place], [place] [address]   Blood Culture (routine x 2)     Status: None (Preliminary result)   Collection Time: 07/27/21  6:32 PM   Specimen: BLOOD  Result Value Ref Range Status   Specimen Description BLOOD LEFT ANTECUBITAL  Final   Special Requests   Final    BOTTLES DRAWN AEROBIC AND ANAEROBIC Blood Culture results may not be optimal due to an inadequate volume of blood received in culture bottles   Culture   Final    NO GROWTH < 12 HOURS Performed at [facility], [address] [place] [address], [place], [place] [address]    Report Status PENDING  Incomplete  Blood Culture (routine x 2)     Status: None (Preliminary result)   Collection Time: 07/27/21  6:33 PM  Specimen: BLOOD  Result Value Ref Range Status   Specimen Description BLOOD RIGHT ANTECUBITAL  Final   Special Requests   Final    BOTTLES DRAWN AEROBIC AND ANAEROBIC Blood Culture results may not be optimal due to an excessive volume of blood received in culture bottles   Culture   Final    NO GROWTH < 12 HOURS Performed at [facility], [address], [place], [place] [address]    Report Status PENDING  Incomplete  Resp Panel by RT-PCR (Flu A&B, Covid) Nasopharyngeal Swab     Status: Abnormal   Collection Time: 07/27/21  6:33 PM   Specimen: Nasopharyngeal Swab; Nasopharyngeal(NP) swabs in vial transport medium  Result Value Ref Range Status   SARS Coronavirus 2 by RT PCR POSITIVE (A) NEGATIVE Final    Comment: (NOTE) SARS-CoV-2 target nucleic acids are DETECTED.  The SARS-CoV-2 RNA is generally detectable in upper respiratory specimens during the acute phase of infection. Positive results are indicative of the presence of the  identified virus, but do not rule out bacterial infection or co-infection with other pathogens not detected by the test. Clinical correlation with patient history and other diagnostic information is necessary to determine patient infection status. The expected result is Negative.  Fact Sheet for Patients: EntrepreneurPulse.com.au  Fact Sheet for Healthcare Providers: IncredibleEmployment.be  This test is not yet approved or cleared by the [place] FDA and  has been authorized for detection and/or diagnosis of SARS-CoV-2 by FDA under an Emergency Use Authorization (EUA).  This EUA will remain in effect (meaning this test can be used) for the duration of  the COVID-19 declaration under Section 564(b)(1) of the A ct, 21 U.S.C. section 360bbb-3(b)(1), unless the authorization is terminated or revoked sooner.     Influenza A by PCR NEGATIVE NEGATIVE Final   Influenza B by PCR NEGATIVE NEGATIVE Final    Comment: (NOTE) The Xpert Xpress SARS-CoV-2/FLU/RSV plus assay is intended as an aid in the diagnosis of influenza from Nasopharyngeal swab specimens and should not be used as a sole basis for treatment. Nasal washings and aspirates are unacceptable for Xpert Xpress SARS-CoV-2/FLU/RSV testing.  Fact Sheet for Patients: EntrepreneurPulse.com.au  Fact Sheet for Healthcare Providers: IncredibleEmployment.be  This test is not yet approved or cleared by the [place] FDA and has been authorized for detection and/or diagnosis of SARS-CoV-2 by FDA under an Emergency Use Authorization (EUA). This EUA will remain in effect (meaning this test can be used) for the duration of the COVID-19 declaration under Section 564(b)(1) of the Act, 21 U.S.C. section 360bbb-3(b)(1), unless the authorization is terminated or revoked.  Performed at [facility], [address] [place],  [address]           Radiology Studies:    DG Chest 2 View  Result Date: 07/27/2021 CLINICAL DATA:  Shortness of breath EXAM: CHEST - 2 VIEW COMPARISON:  12/09/2019 FINDINGS: Stable mild cardiomegaly. Aortic atherosclerosis. Patchy airspace consolidation within the right lower lobe. Small right pleural effusion. Minimal atelectasis in the left lung base. Left lung is otherwise clear. No pneumothorax. IMPRESSION: Right lower lobe pneumonia with small right pleural effusion. Electronically Signed   By: Davina Poke D.O.   On: 07/27/2021 16:30        Medications:    Scheduled Meds:  enoxaparin (LOVENOX) injection  40 mg Subcutaneous Q24H   gabapentin  200 mg Oral QHS   oxyCODONE  10 mg Oral Q12H   Continuous Infusions:  azithromycin Stopped (  07/27/21 2037)   cefTRIAXone (ROCEPHIN)  IV Stopped (07/27/21 2037)       LOS: 1 day    Time spent: 35 minutes    Leslee Home, MD [facility]   To contact the attending provider between 7A-7P or the covering provider during after hours 7P-7A, please log into the web site [email or web address] and access using universal [place] password for that web site. If you do not have the password, please call the hospital operator.  07/28/2021, 12:46 PM

## 2021-07-28 NOTE — Progress Notes (Signed)
Pt highly confused unable to complete admission profile.

## 2021-07-28 NOTE — Progress Notes (Signed)
°  Chaplain On-Call responded to a call from [employer], who requested the Chaplain to visit with the patient. Merry Proud stated that the patient frequently calls out for Jesus, and suggested that the patient might be helped by a prayer.  Chaplain learned from [place] and [employer] that the patient exhibits confusion, and this Chaplain experienced that when visiting the patient.  Chaplain was able to hear from the patient that she wanted a prayer. Chaplain provided prayer and spiritual support for the patient.  Chaplain Evelena Peat M.Div., [facility]

## 2021-07-28 NOTE — Evaluation (Addendum)
Speech Language Pathology Evaluation Patient Details Name: Shaconda Hajduk MRN: [id number] DOB: 06/21/1923 Today's Date: 07/28/2021 Time: 6213-0865 SLP Time Calculation (min) (ACUTE ONLY): 20 min  Problem List:  Patient Active Problem List   Diagnosis Date Noted   Acute respiratory failure (HCC) 07/27/2021   CAP (community acquired pneumonia) 07/27/2021   COVID-19 virus infection 07/27/2021   Arthritis    Cervical radiculopathy    Past Medical History:  Past Medical History:  Diagnosis Date   Arthritis    Cervical radiculopathy    DJD (degenerative joint disease)    Left arm pain    Past Surgical History: History reviewed. No pertinent surgical history. HPI: per MD note: "88 y.o. female with medical history significant for cervical radiculopathy, degenerative joint disease, recent COVID-19 infection (diagnosed on 07/18/21) who was brought into the ER by her son for evaluation of weakness and increased lethargy over the last couple of days. In the ER she was noted to be hypoxic with room air pulse oximetry of 85% and was placed on 2 L of oxygen to improve her pulse oximetry to 92%.  Chest x-ray reviewed a right lower lobe pneumonia with a small right pleural effusion." ED provider noted PMH includes dementia.  Assessment / Plan / Recommendation Clinical Impression  Patient presents with functional oropharyngeal swallow as demonstrated by no overt s/s penetration aspiration across trials of thin liquids, puree, and solids. Oral phase functional for oral containment, preparation, and clearance during trials. Note: cough at baseline iso COVID. Patient with occasional cough between trials though timing and nature c/w baseline and not reflexive/overt in nature to suspect relation to swallow. Patient demonstrated reduced awareness and attention iso known baseline dementia which may reduce safety therefore recommend modification of solids to mechanical soft and utilize 100% total supervision  during PO intake. Recommendations written on patient communication board, d/w RN/MD and aspiratin precautions in place.  SLP to follow up x1 to ensure PO tolerance and appropriate diet level.    SLP Assessment  SLP Visit Diagnosis: Attention and concentration deficit Attention and concentration deficit following:  (baseline dementia)    Recommendations for follow up therapy are one component of a multi-disciplinary discharge planning process, led by the attending physician.  Recommendations may be updated based on patient status, additional functional criteria and insurance authorization.    Follow Up Recommendations   (Do not anticipate SLP services at d/c)    Assistance Recommended at Discharge   Supervision  Functional Status Assessment  (Suspect patient near baseline swallow; will FU to confirm)  Frequency and Duration min 1 x/week         SLP Evaluation Cognition  Overall Cognitive Status: History of cognitive impairments - at baseline (dementia)      Comprehension    Inconsistently followed simple commands, errors iso confusion, fear, and baseline cognitive impairment (dementia)   Expression Written Expression Dominant Hand: Right   Oral / Motor  Oral Motor/Sensory Function Overall Oral Motor/Sensory Function: Within functional limits   [place], M.A. CCC-SLP         [place] L Shakiyah Cirilo 07/28/2021, 2:03 PM

## 2021-07-29 DIAGNOSIS — J9601 Acute respiratory failure with hypoxia: Secondary | ICD-10-CM | POA: Diagnosis not present

## 2021-07-29 LAB — CBC
HCT: 31.9 % — ABNORMAL LOW (ref 36.0–46.0)
Hemoglobin: 10.5 g/dL — ABNORMAL LOW (ref 12.0–15.0)
MCH: 31.6 pg (ref 26.0–34.0)
MCHC: 32.9 g/dL (ref 30.0–36.0)
MCV: 96.1 fL (ref 80.0–100.0)
Platelets: 372 10*3/uL (ref 150–400)
RBC: 3.32 MIL/uL — ABNORMAL LOW (ref 3.87–5.11)
RDW: 11.9 % (ref 11.5–15.5)
WBC: 19.2 10*3/uL — ABNORMAL HIGH (ref 4.0–10.5)
nRBC: 0 % (ref 0.0–0.2)

## 2021-07-29 LAB — POTASSIUM: Potassium: 3.7 mmol/L (ref 3.5–5.1)

## 2021-07-29 MED ORDER — DEXAMETHASONE 4 MG PO TABS
6.0000 mg | ORAL_TABLET | Freq: Every day | ORAL | Status: DC
  Administered 2021-07-29 – 2021-07-30 (×2): 6 mg via ORAL
  Filled 2021-07-29 (×2): qty 2

## 2021-07-29 MED ORDER — GUAIFENESIN-DM 100-10 MG/5ML PO SYRP
5.0000 mL | ORAL_SOLUTION | ORAL | Status: DC | PRN
  Administered 2021-07-29 – 2021-07-30 (×2): 5 mL via ORAL
  Filled 2021-07-29 (×3): qty 5

## 2021-07-29 MED ORDER — IPRATROPIUM-ALBUTEROL 0.5-2.5 (3) MG/3ML IN SOLN
3.0000 mL | Freq: Two times a day (BID) | RESPIRATORY_TRACT | Status: DC
  Administered 2021-07-29: 3 mL via RESPIRATORY_TRACT
  Filled 2021-07-29 (×2): qty 3

## 2021-07-29 NOTE — Progress Notes (Addendum)
Progress Note:    Hannah Dillon    PPJ:[id number] DOB: [birth year] DOA: 07/27/2021  PCP: System, Provider Not In    Brief Narrative:   88 year old Caucasian female with a past medical history significant for dementia, cervical radiculopathy, DJD, recent COVID-19 infection (diagnosed on 07/18/2021).  This patient was brought in to the ER by her son for evaluation for weakness and increased lethargy over the past several days.  Of note the patient was seen at local urgent care for evaluation of a cough and nasal congestion and at that time her COVID-19 PCR test was positive.  She was treated with doxycycline, Tessalon Perles and Atrovent nasal spray.  Instead she feels very weak and has become increasingly lethargic.  Found to be saturating 85% on room air in the emergency department.  Admitted for acute hypoxic respiratory failure due to community-acquired pneumonia and COVID-19 infection.    Subjective:   Seen this morning with son present at bedside.  OT evaluating the patient currently.  Titrated the patient down to 1 L nasal cannula.  She feels very weak.  Denies any chest pain.  She lives with her son.  Can tell me her name and where she is.  Cannot tell me the date.  Son states that she does have some underlying dementia and cannot normally tell the date.  But states that she seems a little more confused than usual.   Assessment and Plan:   Acute hypoxic respiratory failure secondary to community acquired pneumonia and COVID-19 infection: Continue isolation precautions can discontinue tomorrow.  Chest x-ray showed right lower lobe infiltrate.  Will stop abx as covid typically not complicated by superimposed bacterial infections. Weaned down to 1 L nasal cannula.  Cont  scheduled bronchodilators with DuoNebs.  She met hypoxic respiratory failure criteria with pulse ox of 85% on room air in the emergency department. Will start dexamethasone given the hypoxia.   Sepsis: 2/2 above.  UA/UCx pending. Blood cultures negative to date. Unable to obtain sputum culture as the patient is not able to produce sputum sample due to dry cough.  Generalized weakness: due to the above. PT/OT consulted to see if patient needs HHPT/OT vs SNF rehab upon discharge. Son would like snf if qualifies  Chronic pain: From cervical radiculopathy and DJD.  Continue home gabapentin.  On pain scale.  Holding home Norco and [facility].  Dementia: appears stable. Son lives with her. Sitter at bedside, apparently had behavioral disturbance on presentation. Will query nursing about need for continuing.   Elevated blood pressure: no formal hx htn. Will monitor, consider starting antihypertensive if bp remains elevated    Other information:    DVT prophylaxis: Lovenox subcu Code Status: DNR Family Communication: son updated telephonically 1/14 Disposition:   Status is: Inpatient  Remains inpatient appropriate because: dispo planning     Consultants:   None    Objective:    Vitals:   07/28/21 1626 07/28/21 2057 07/29/21 0412 07/29/21 0814  BP: (!) 188/73 (!) 130/59 (!) 129/47 (!) 160/70  Pulse: 85 82 74 81  Resp: 19  18 16   Temp: 98.4 F (36.9 C) 98 F (36.7 C) 97.9 F (36.6 C) 98 F (36.7 C)  TempSrc: Oral Axillary Axillary   SpO2: 93% 93%  97%  Weight:      Height:        Intake/Output Summary (Last 24 hours) at 07/29/2021 1314 Last data filed at 07/28/2021 1910 Gross per 24 hour  Intake 100  ml  Output 150 ml  Net -50 ml   Filed Weights   07/28/21 1031  Weight: 56.7 kg       Physical Exam:    General exam: Appears calm and comfortable  Respiratory system: Right lower lobe decreased breath sounds Cardiovascular system: S1 & S2 heard, RRR. No JVD, murmurs, rubs, gallops or clicks. No pedal edema. Gastrointestinal system: Abdomen is nondistended, soft and nontender. No organomegaly or masses felt. Normal bowel sounds heard. Central nervous system: Alert and  oriented x2 to name with intermittent confusion. No focal neurological deficits. Extremities: Symmetric 5 x 5 power.  Generalized weakness. Skin: No rashes, lesions or ulcers Psychiatry: Judgement and insight appear normal. Mood & affect appropriate.     Data Reviewed:    I have personally reviewed following labs and imaging studies  CBC: Recent Labs  Lab 07/27/21 1555 07/28/21 0653 07/29/21 0443  WBC 26.2* 28.5* 19.2*  HGB 12.1 11.3* 10.5*  HCT 35.6* 33.7* 31.9*  MCV 95.2 94.4 96.1  PLT 377 375 161    Basic Metabolic Panel: Recent Labs  Lab 07/27/21 1555 07/28/21 0653 07/29/21 0443  NA 134* 136  --   K 3.6 3.4* 3.7  CL 97* 99  --   CO2 26 26  --   GLUCOSE 160* 136*  --   BUN 27* 19  --   CREATININE 0.74 0.57  --   CALCIUM 8.9 8.6*  --     GFR: Estimated Creatinine Clearance: 35.5 mL/min (by C-G formula based on SCr of 0.57 mg/dL).  Liver Function Tests: No results for input(s): AST, ALT, ALKPHOS, BILITOT, PROT, ALBUMIN in the last 168 hours.  CBG: No results for input(s): GLUCAP in the last 168 hours.   Recent Results (from the past 240 hour(s))  Blood Culture (routine x 2)     Status: None (Preliminary result)   Collection Time: 07/27/21  6:32 PM   Specimen: BLOOD  Result Value Ref Range Status   Specimen Description BLOOD LEFT ANTECUBITAL  Final   Special Requests   Final    BOTTLES DRAWN AEROBIC AND ANAEROBIC Blood Culture results may not be optimal due to an inadequate volume of blood received in culture bottles   Culture   Final    NO GROWTH 2 DAYS Performed at [facility], [address] [place], [place] [address]    Report Status PENDING  Incomplete  Blood Culture (routine x 2)     Status: None (Preliminary result)   Collection Time: 07/27/21  6:33 PM   Specimen: BLOOD  Result Value Ref Range Status   Specimen Description BLOOD RIGHT ANTECUBITAL  Final   Special Requests   Final    BOTTLES DRAWN AEROBIC AND ANAEROBIC Blood  Culture results may not be optimal due to an excessive volume of blood received in culture bottles   Culture   Final    NO GROWTH 2 DAYS Performed at [facility], [address] [place], [place] [address]    Report Status PENDING  Incomplete  Resp Panel by RT-PCR (Flu A&B, Covid) Nasopharyngeal Swab     Status: Abnormal   Collection Time: 07/27/21  6:33 PM   Specimen: Nasopharyngeal Swab; Nasopharyngeal(NP) swabs in vial transport medium  Result Value Ref Range Status   SARS Coronavirus 2 by RT PCR POSITIVE (A) NEGATIVE Final    Comment: (NOTE) SARS-CoV-2 target nucleic acids are DETECTED.  The SARS-CoV-2 RNA is generally detectable in upper respiratory specimens during the acute phase  of infection. Positive results are indicative of the presence of the identified virus, but do not rule out bacterial infection or co-infection with other pathogens not detected by the test. Clinical correlation with patient history and other diagnostic information is necessary to determine patient infection status. The expected result is Negative.  Fact Sheet for Patients: EntrepreneurPulse.com.au  Fact Sheet for Healthcare Providers: IncredibleEmployment.be  This test is not yet approved or cleared by the [place] FDA and  has been authorized for detection and/or diagnosis of SARS-CoV-2 by FDA under an Emergency Use Authorization (EUA).  This EUA will remain in effect (meaning this test can be used) for the duration of  the COVID-19 declaration under Section 564(b)(1) of the A ct, 21 U.S.C. section 360bbb-3(b)(1), unless the authorization is terminated or revoked sooner.     Influenza A by PCR NEGATIVE NEGATIVE Final   Influenza B by PCR NEGATIVE NEGATIVE Final    Comment: (NOTE) The Xpert Xpress SARS-CoV-2/FLU/RSV plus assay is intended as an aid in the diagnosis of influenza from Nasopharyngeal swab specimens and should not be used as a  sole basis for treatment. Nasal washings and aspirates are unacceptable for Xpert Xpress SARS-CoV-2/FLU/RSV testing.  Fact Sheet for Patients: EntrepreneurPulse.com.au  Fact Sheet for Healthcare Providers: IncredibleEmployment.be  This test is not yet approved or cleared by the [place] FDA and has been authorized for detection and/or diagnosis of SARS-CoV-2 by FDA under an Emergency Use Authorization (EUA). This EUA will remain in effect (meaning this test can be used) for the duration of the COVID-19 declaration under Section 564(b)(1) of the Act, 21 U.S.C. section 360bbb-3(b)(1), unless the authorization is terminated or revoked.  Performed at [facility], [address] [place], [place] [address]   MRSA Next Gen by PCR, Nasal     Status: None   Collection Time: 07/28/21  1:08 PM   Specimen: Nasal Mucosa; Nasal Swab  Result Value Ref Range Status   MRSA by PCR Next Gen NOT DETECTED NOT DETECTED Final    Comment: (NOTE) The GeneXpert MRSA Assay (FDA approved for NASAL specimens only), is one component of a comprehensive MRSA colonization surveillance program. It is not intended to diagnose MRSA infection nor to guide or monitor treatment for MRSA infections. Test performance is not FDA approved in patients less than 58 years old. Performed at [facility], [address], [place], [place] [address]          Radiology Studies:    DG Chest 2 View  Result Date: 07/27/2021 CLINICAL DATA:  Shortness of breath EXAM: CHEST - 2 VIEW COMPARISON:  12/09/2019 FINDINGS: Stable mild cardiomegaly. Aortic atherosclerosis. Patchy airspace consolidation within the right lower lobe. Small right pleural effusion. Minimal atelectasis in the left lung base. Left lung is otherwise clear. No pneumothorax. IMPRESSION: Right lower lobe pneumonia with small right pleural effusion. Electronically Signed   By: Davina Poke D.O.    On: 07/27/2021 16:30        Medications:    Scheduled Meds:  enoxaparin (LOVENOX) injection  40 mg Subcutaneous Q24H   gabapentin  200 mg Oral QHS   ipratropium-albuterol  3 mL Nebulization BID   oxyCODONE  10 mg Oral QHS   Continuous Infusions:  azithromycin 500 mg (07/28/21 1619)   cefTRIAXone (ROCEPHIN)  IV Stopped (07/28/21 1910)       LOS: 2 days    Time spent: 35 minutes    Desma Maxim, MD [facility]   To contact  the attending provider between 7A-7P or the covering provider during after hours 7P-7A, please log into the web site [email or web address] and access using universal  password for that web site. If you do not have the password, please call the hospital operator.  07/29/2021, 1:14 PM

## 2021-07-29 NOTE — Evaluation (Signed)
Physical Therapy Evaluation Patient Details Name: Hannah Dillon MRN: [id number] DOB: 02/07/1923 Today's Date: 07/29/2021  History of Present Illness  Pt is a 88 y/o F admitted on 07/27/21 after presenting to the ED for evaluation of weakness & increased lethargy over the past several days. Pt is being treated for acute hypoxic respiratory failure 2/2 CAP & covid-19 infection. PMH: dementia, cervical radiculopathy, DJD, recent covid infection (dx 07/18/21)  Clinical Impression  Pt seen for PT evaluation with sitter present for room. Pt is only oriented to self but is easily redirected during session. Pt is able to ambulate in room with 1UE HHA min assist with PT providing stabilization 2/2 impaired balance. Pt on 2L/min via nasal cannula with SpO2 >90%. Pt will require 24 hr assist upon d/c with recommendations of HHPT f/u. Will continue to follow pt acutely to progress gait with LRAD, balance, strength & stair negotiation.       Recommendations for follow up therapy are one component of a multi-disciplinary discharge planning process, led by the attending physician.  Recommendations may be updated based on patient status, additional functional criteria and insurance authorization.  Follow Up Recommendations Home health PT    Assistance Recommended at Discharge Frequent or constant Supervision/Assistance  Patient can return home with the following  Assistance with cooking/housework;A little help with bathing/dressing/bathroom;A little help with walking and/or transfers;Direct supervision/assist for financial management;Assist for transportation;Assistance with feeding;Help with stairs or ramp for entrance;Direct supervision/assist for medications management    Equipment Recommendations None recommended by PT  Recommendations for Other Services       Functional Status Assessment Patient has had a recent decline in their functional status and demonstrates the ability to make significant  improvements in function in a reasonable and predictable amount of time.     Precautions / Restrictions Precautions Precautions: Fall Restrictions Weight Bearing Restrictions: No      Mobility  Bed Mobility Overal bed mobility: Needs Assistance Bed Mobility: Supine to Sit     Supine to sit: [facility];Min assist (tactile cuing to initiate, then holds to PTs hands for support to upright trunk.)          Transfers Overall transfer level: Needs assistance Equipment used: 1 person hand held assist Transfers: Sit to/from Stand Sit to Stand: Min assist (holds to bed rail with other UE.)                Ambulation/Gait Ambulation/Gait assistance: Min assist Gait Distance (Feet): 25 Feet (+ 10 ft) Assistive device: 1 person hand held assist Gait Pattern/deviations: Decreased step length - left;Decreased stride length;Decreased step length - right Gait velocity: decreased        Stairs            Wheelchair Mobility    Modified Rankin (Stroke Patients Only)       Balance Overall balance assessment: Needs assistance Sitting-balance support: Feet supported;Bilateral upper extremity supported Sitting balance-Leahy Scale: Fair       Standing balance-Leahy Scale: Poor                               Pertinent Vitals/Pain Pain Assessment: Faces Faces Pain Scale: No hurt    Home Living Family/patient expects to be discharged to:: Private residence Living Arrangements: Children Available Help at Discharge: Family;Available 24 hours/day Type of Home: House Home Access: Stairs to enter   Entergy CorporationEntrance Stairs-Number of Steps: 2-3   Home Layout: One level Home Equipment:  Rolling Walker (2 wheels)      Prior Function               Mobility Comments: Pt has RW but does not use at baseline for functional mobility. Son reports she has had 3 falls in 6 months. ADLs Comments: Pt performs sink bathing but has been coming more paranoid and not  doing "a great job" per son. Son assists with medication , meals, and taking pt to appointments.     Hand Dominance        Extremity/Trunk Assessment   Upper Extremity Assessment Upper Extremity Assessment: Generalized weakness    Lower Extremity Assessment Lower Extremity Assessment: Generalized weakness    Cervical / Trunk Assessment Cervical / Trunk Assessment: Kyphotic  Communication   Communication: No difficulties  Cognition Arousal/Alertness: Awake/alert   Overall Cognitive Status: History of cognitive impairments - at baseline Area of Impairment: Orientation;Attention;Following commands;Memory;Awareness;Problem solving;Safety/judgement                 Orientation Level: Disoriented to;Place;Time;Situation   Memory: Decreased short-term memory Following Commands: Follows one step commands consistently Safety/Judgement: Decreased awareness of safety;Decreased awareness of deficits Awareness: Intellectual Problem Solving: Decreased initiation;Requires verbal cues General Comments: Pt responds to name during session & is able to follow simple commands with extra time PRN. Pt requests to lie down so she can "go meet Jesus" & states "give me a kiss" but easily redirected during session.        General Comments General comments (skin integrity, edema, etc.): Pt on 2L/min with SPO2 >90%; BUE mittens donned at beginning of session with PT removing them for session.    Exercises     Assessment/Plan    PT Assessment Patient needs continued PT services  PT Problem List Decreased strength;Decreased mobility;Decreased safety awareness;Decreased coordination;Decreased balance;Decreased cognition;Decreased activity tolerance;Cardiopulmonary status limiting activity;Decreased knowledge of use of DME       PT Treatment Interventions Therapeutic exercise;DME instruction;Gait training;Balance training;Stair training;Neuromuscular re-education;Functional mobility  training;Cognitive remediation;Therapeutic activities;Patient/family education    PT Goals (Current goals can be found in the Care Plan section)  Acute Rehab PT Goals PT Goal Formulation: Patient unable to participate in goal setting Time For Goal Achievement: 08/12/21 Potential to Achieve Goals: Fair    Frequency Min 2X/week     Co-evaluation               AM-PAC PT "6 Clicks" Mobility  Outcome Measure Help needed turning from your back to your side while in a flat bed without using bedrails?: A Little Help needed moving from lying on your back to sitting on the side of a flat bed without using bedrails?: A Lot Help needed moving to and from a bed to a chair (including a wheelchair)?: A Little Help needed standing up from a chair using your arms (e.g., wheelchair or bedside chair)?: A Little Help needed to walk in hospital room?: A Little Help needed climbing 3-5 steps with a railing? : A Lot 6 Click Score: 16    End of Session Equipment Utilized During Treatment: Oxygen;Gait belt Activity Tolerance: Patient tolerated treatment well Patient left: in chair;with nursing/sitter in room Nurse Communication: Mobility status PT Visit Diagnosis: Unsteadiness on feet (R26.81);Muscle weakness (generalized) (M62.81);Difficulty in walking, not elsewhere classified (R26.2)    Time: 5053-9767 PT Time Calculation (min) (ACUTE ONLY): 10 min   Charges:   PT Evaluation $PT Eval Low Complexity: 1 Low          Aleda Grana, PT, DPT  07/29/21, 2:55 PM   Sandi Mariscal 07/29/2021, 2:54 PM

## 2021-07-29 NOTE — Progress Notes (Signed)
Reordered 1:1 safety order

## 2021-07-29 NOTE — Progress Notes (Signed)
D/w nsg; pt is tolerating current diet, being fed by sitter with no difficulties reported. SLP to s/o.   Rondel Baton, MS, Sports administrator

## 2021-07-30 DIAGNOSIS — R531 Weakness: Secondary | ICD-10-CM

## 2021-07-30 DIAGNOSIS — I10 Essential (primary) hypertension: Secondary | ICD-10-CM | POA: Diagnosis present

## 2021-07-30 DIAGNOSIS — J9621 Acute and chronic respiratory failure with hypoxia: Secondary | ICD-10-CM | POA: Diagnosis not present

## 2021-07-30 DIAGNOSIS — A419 Sepsis, unspecified organism: Secondary | ICD-10-CM | POA: Diagnosis present

## 2021-07-30 DIAGNOSIS — F0390 Unspecified dementia without behavioral disturbance: Secondary | ICD-10-CM | POA: Diagnosis present

## 2021-07-30 DIAGNOSIS — G8929 Other chronic pain: Secondary | ICD-10-CM | POA: Diagnosis present

## 2021-07-30 LAB — CBC
HCT: 32.7 % — ABNORMAL LOW (ref 36.0–46.0)
Hemoglobin: 10.9 g/dL — ABNORMAL LOW (ref 12.0–15.0)
MCH: 31.5 pg (ref 26.0–34.0)
MCHC: 33.3 g/dL (ref 30.0–36.0)
MCV: 94.5 fL (ref 80.0–100.0)
Platelets: 413 10*3/uL — ABNORMAL HIGH (ref 150–400)
RBC: 3.46 MIL/uL — ABNORMAL LOW (ref 3.87–5.11)
RDW: 11.4 % — ABNORMAL LOW (ref 11.5–15.5)
WBC: 15 10*3/uL — ABNORMAL HIGH (ref 4.0–10.5)
nRBC: 0 % (ref 0.0–0.2)

## 2021-07-30 LAB — BASIC METABOLIC PANEL
Anion gap: 8 (ref 5–15)
BUN: 15 mg/dL (ref 8–23)
CO2: 28 mmol/L (ref 22–32)
Calcium: 8.8 mg/dL — ABNORMAL LOW (ref 8.9–10.3)
Chloride: 97 mmol/L — ABNORMAL LOW (ref 98–111)
Creatinine, Ser: 0.42 mg/dL — ABNORMAL LOW (ref 0.44–1.00)
GFR, Estimated: >60 mL/min (ref >60)
Glucose, Bld: 183 mg/dL — ABNORMAL HIGH (ref 70–99)
Potassium: 3.7 mmol/L (ref 3.5–5.1)
Sodium: 133 mmol/L — ABNORMAL LOW (ref 135–145)

## 2021-07-30 MED ORDER — IPRATROPIUM-ALBUTEROL 20-100 MCG/ACT IN AERS
1.0000 | INHALATION_SPRAY | Freq: Two times a day (BID) | RESPIRATORY_TRACT | Status: DC
  Administered 2021-07-30 – 2021-07-31 (×2): 1 via RESPIRATORY_TRACT
  Filled 2021-07-30: qty 4

## 2021-07-30 MED ORDER — AMLODIPINE BESYLATE 5 MG PO TABS
5.0000 mg | ORAL_TABLET | Freq: Every day | ORAL | Status: DC
  Administered 2021-07-30 – 2021-07-31 (×2): 5 mg via ORAL
  Filled 2021-07-30 (×2): qty 1

## 2021-07-30 MED ORDER — HALOPERIDOL LACTATE 5 MG/ML IJ SOLN
1.0000 mg | Freq: Once | INTRAMUSCULAR | Status: AC
  Administered 2021-07-30: 1 mg via INTRAVENOUS
  Filled 2021-07-30: qty 1

## 2021-07-30 NOTE — Hospital Course (Signed)
88 year old Caucasian female with a past medical history significant for dementia, cervical radiculopathy, DJD, recent COVID-19 infection (diagnosed on 07/18/2021).  This patient was brought in to the ER by her son for evaluation for weakness and increased lethargy over the past several days.  Of note the patient was seen at local urgent care for evaluation of a cough and nasal congestion and at that time her COVID-19 PCR test was positive.  She was treated with doxycycline, Tessalon Perles and Atrovent nasal spray.  Instead she feels very weak and has become increasingly lethargic.  Found to be saturating 85% on room air in the emergency department.  Admitted for acute hypoxic respiratory failure due to community-acquired pneumonia and COVID-19 infection.

## 2021-07-30 NOTE — Assessment & Plan Note (Signed)
PT and OT evaluated and both recommend home health for ongoing rehab.  TOC following. -- Fall precautions

## 2021-07-30 NOTE — Assessment & Plan Note (Signed)
BPs have been quite uncontrolled, requiring as needed IV labetalol. -- Start amlodipine 5 mg daily --IV labetalol as needed -- Discussed home BP monitoring with son, agreeable -- PCP follow-up

## 2021-07-30 NOTE — TOC Initial Note (Addendum)
Transition of Care [facility]) - Initial/Assessment Note    Patient Details  Name: Hannah Dillon MRN: [id number] Date of Birth: 11/06/1922  Transition of Care [facility]) CM/SW Contact:    Magnus Ivan, LCSW Phone Number: 07/30/2021, 12:13 PM  Clinical Narrative:                Spoke with patient's son Chrissie Noa regarding DC planning. Patient lives with Chrissie Noa who provides transportation. PCP is [facility]. Pharmacy is [facility]. Patient has a RW. No HH history. Confirmed home address.  Chrissie Noa is agreeable to recs for 3 in 1 and HH. No agency preferences.  Chrissie Noa is aware of recommendation for 24 hour supervision and states he will provide this for patient at home. HH referral made to [facility] with [facility]. 3in1 referral made to [facility] with [facility]. 3 in 1 to be shipped to patient's home per son's request. RN to notify CSW if home o2 needs to be ordered.  1:02- Per RN no o2 needed after walk test.  Expected Discharge Plan: [place] Barriers to Discharge: Continued Medical Work up   Patient Goals and CMS Choice Patient states their goals for this hospitalization and ongoing recovery are:: home with home health CMS Medicare.gov Compare Post Acute Care list provided to:: Patient Represenative (must comment) Choice offered to / list presented to : Adult Children  Expected Discharge Plan and Services Expected Discharge Plan: [place] arrangements for the past 2 months: [place]                 DME Arranged: 3-N-1 DME Agency: [facility] Date DME Agency Contacted: 07/30/21   Representative spoke with at DME Agency: [place]: PT [place]: [place] ([place]) Date [place]: 07/30/21   Representative spoke with at [place]: Corene Cornea  Prior Living Arrangements/Services Living arrangements for the past 2 months: [place] Lives with:: Adult Children Patient language  and need for interpreter reviewed:: Yes Do you feel safe going back to the place where you live?: Yes      Need for Family Participation in Patient Care: Yes (Comment) Care giver support system in place?: Yes (comment) Current home services: DME Criminal Activity/Legal Involvement Pertinent to Current Situation/Hospitalization: No - Comment as needed  Activities of Daily Living Home Assistive Devices/Equipment: None ADL Screening (condition at time of admission) Patient's cognitive ability adequate to safely complete daily activities?: No Is the patient deaf or have difficulty hearing?: No Does the patient have difficulty seeing, even when wearing glasses/contacts?: No Does the patient have difficulty concentrating, remembering, or making decisions?: Yes Patient able to express need for assistance with ADLs?: No Does the patient have difficulty dressing or bathing?: Yes Independently performs ADLs?: No Communication: Independent Dressing (OT): Dependent Is this a change from baseline?: Change from baseline, expected to last >3 days Grooming: Dependent Is this a change from baseline?: Change from baseline, expected to last >3 days Feeding: Needs assistance Is this a change from baseline?: Change from baseline, expected to last >3 days Bathing: Dependent Is this a change from baseline?: Change from baseline, expected to last >3 days Toileting: Needs assistance Is this a change from baseline?: Change from baseline, expected to last >3days In/Out Bed: Needs assistance Is this a change from baseline?: Pre-admission baseline Walks in Home: Needs assistance Is this a change from baseline?: Pre-admission baseline Does the patient have difficulty walking or climbing  stairs?: Yes Weakness of Legs: Both Weakness of Arms/Hands: Both  Permission Sought/Granted Permission sought to share information with : Facility Art therapist granted to share information with : Yes,  Verbal Permission Granted (by son)     Permission granted to share info w AGENCY: [place] and DME        Emotional Assessment       Orientation: : Fluctuating Orientation (Suspected and/or reported Sundowners) Alcohol / Substance Use: Not Applicable Psych Involvement: No (comment)  Admission diagnosis:  Acute respiratory failure ([place]) [J96.00] Community acquired pneumonia of right lower lobe of lung [J18.9] COVID-19 [U07.1] Patient Active Problem List   Diagnosis Date Noted   Sepsis ([place]) 07/30/2021   Generalized weakness 07/30/2021   Chronic pain 07/30/2021   Hypertension 07/30/2021   Dementia ([place]) 07/30/2021   Acute on chronic respiratory failure with hypoxia ([place]) 07/27/2021   CAP (community acquired pneumonia) 07/27/2021   COVID-19 virus infection 07/27/2021   Degenerative joint disease    Cervical radiculopathy    Vascular dementia without behavioral disturbance ([place]) 03/11/2019   PCP:  System, Provider Not In Pharmacy:   [facility] [address] [place] [address] Phone: [phone] Fax: [place], [place] [facility] [address] [place] [facility] [place] [address] Phone: [phone] Fax: [phone]     Social Determinants of Health (SDOH) Interventions    Readmission Risk Interventions No flowsheet data found.

## 2021-07-30 NOTE — Progress Notes (Signed)
°   07/30/21 0755  Oxygen Therapy  SpO2 96 %  O2 Device Room Air  Patient Activity (if Appropriate) In bed

## 2021-07-30 NOTE — Progress Notes (Addendum)
Progress Note   Patient: Hannah Dillon [id number] DOB: May 25, 1923 DOA: 07/27/2021     3 DOS: the patient was seen and examined on 07/30/2021   Brief hospital course: 88 year old Caucasian female with a past medical history significant for dementia, cervical radiculopathy, DJD, recent COVID-19 infection (diagnosed on 07/18/2021).  This patient was brought in to the ER by her son for evaluation for weakness and increased lethargy over the past several days.  Of note the patient was seen at local urgent care for evaluation of a cough and nasal congestion and at that time her COVID-19 PCR test was positive.  She was treated with doxycycline, Tessalon Perles and Atrovent nasal spray.  Instead she feels very weak and has become increasingly lethargic.  Found to be saturating 85% on room air in the emergency department.  Admitted for acute hypoxic respiratory failure due to community-acquired pneumonia and COVID-19 infection.  Assessment and Plan * Acute on chronic respiratory failure with hypoxia (HCC)- (present on admission) Present on admission with room air O2 sat in the ED of 85%.  Resolved.  Patient currently on room air and maintains O2 sats above 90% with ambulation and at rest. -- Monitor O2 sats and supplement as needed if less than 90% on room air  Sepsis (HCC)- (present on admission) Present on admission, resolved. Patient had tachycardia and leukocytosis in the setting of COVID-19 infection and pneumonia. Sepsis physiology is resolved. Treated with empiric antibiotics on admission. --Urine culture pending, unclear if collected -- MRSA screen negative -- Blood cultures negative x3 days, follow  COVID-19 virus infection- (present on admission) Present on admission.  Tested positive on 07/18/2021 Discontinue isolation precautions. Chest x-ray showed right lower lobe infiltrate. Stop dexamethasone given resolution of hypoxia and steroids  contributing to her delirium and  hypertension.  CAP (community acquired pneumonia)- (present on admission) Empiric antibiotics were ordered on admission, later discontinued and patient remains clinically stable and improved.  Superimposed bacterial infections in setting of COVID infection are not common.   --Continue to monitor clinically -- Supportive care, Tylenol, Robitussin as needed  Generalized weakness PT and OT evaluated and both recommend home health for ongoing rehab.  TOC following. -- Fall precautions  Chronic pain- (present on admission) Due to cervical radiculopathy and DJD.  Continue home gabapentin.  Home Norco and Xtampza ER are on hold.  Patient does not appear to have exacerbated pain at this time. --Continue OxyContin 10 mg twice daily  Cervical radiculopathy- (present on admission) Chronic, stable.  Monitor. -- Continue home gabapentin -- Home opioids are currently on hold  Degenerative joint disease- (present on admission) Chronic, stable  Hypertension- (present on admission) BPs have been quite uncontrolled, requiring as needed IV labetalol. -- Start amlodipine 5 mg daily --IV labetalol as needed -- Discussed home BP monitoring with son, agreeable -- PCP follow-up  Dementia (HCC)- (present on admission) Patient appears to have fairly significant dementia.  Oriented to only self.  She intermittently has episodes of increased anxiety and paranoia.   -- Delirium precautions --Continue sitter for safety -- Low-dose Xanax as needed for anxiety, avoid as much as possible     Subjective: Patient up in recliner chair when seen today.  Sitter at bedside.  No acute events reported.  Patient is pleasantly confused when asked how she is doing she says she is scared.  Sitter reports she occasionally mentions this.  Otherwise patient offers no acute complaints.  Objective Vitals reviewed and notable for uncontrolled blood pressure with systolic in  the last 24 hours as high as 227, best systolic  152.  Diastolic pressures range from 01E to 90s.  No fevers.  Transitioned off 2 L/min nasal cannula oxygen to room air with stable O2 sats 94 to 96%.  General exam: awake, alert, no acute distress, confused HEENT: atraumatic, clear conjunctiva, anicteric sclera, moist mucus membranes, hearing grossly normal  Respiratory system: CTAB, no wheezes, rales or rhonchi, normal respiratory effort. Cardiovascular system: normal S1/S2, RRR, no pedal edema.   Gastrointestinal system: soft, nontender abdomen with present bowel sounds Central nervous system: A&O x1. no gross focal neurologic deficits, normal speech Extremities: moves all, no edema, normal tone Skin: dry, intact, normal temperature Psychiatry: normal mood, congruent affect, abnormal judgement and insight due to dementia    Data Reviewed:  Labs reviewed and notable for sodium 133, chloride 97, glucose 183, creatinine 0.42, calcium 8.8, WBC 15.0 improving, hemoglobin stable 10.9, platelets rising elevated at 413.  Blood culture continue to be negative at 3 days  Family Communication: Updated son by phone today.  He is agreeable to home health services for ongoing therapy.  Patient did not qualify for SNF.  Discussed monitoring closely off oxygen overnight and working on blood pressure control prior to discharge hopefully tomorrow.  Disposition: Status is: Inpatient  Remains inpatient appropriate because: Severity of illness with COVID-19 infection just weaned off oxygen this morning.  Blood pressure is uncontrolled and dangerously elevated at times with ongoing medication changes.         Time spent: 35 minutes  Author: Pennie Banter, DO 07/30/2021 1:48 PM  For on call review [email or web address].

## 2021-07-30 NOTE — Assessment & Plan Note (Addendum)
Patient appears to have fairly significant dementia.  Oriented to only self.  She intermittently has episodes of increased anxiety and paranoia.   -- Delirium precautions --Continue sitter for safety -- Low-dose Xanax as needed for anxiety, avoid as much as possible

## 2021-07-30 NOTE — TOC CM/SW Note (Signed)
Per chart review, patient is disoriented.  Left VM for son Chrissie Noa requesting a return call to discuss PT/OT recs.  Oleh Genin, [place]

## 2021-07-30 NOTE — Assessment & Plan Note (Addendum)
Due to cervical radiculopathy and DJD.  Continue home gabapentin.  Home Norco and Xtampza ER are on hold.  Patient does not appear to have exacerbated pain at this time. --Continue OxyContin 10 mg twice daily

## 2021-07-30 NOTE — Assessment & Plan Note (Addendum)
Chronic, stable.  Monitor. -- Continue home gabapentin -- Home opioids are currently on hold

## 2021-07-30 NOTE — Assessment & Plan Note (Signed)
Present on admission, resolved. Patient had tachycardia and leukocytosis in the setting of COVID-19 infection and pneumonia. Sepsis physiology is resolved. Treated with empiric antibiotics on admission. --Urine culture pending, unclear if collected -- MRSA screen negative -- Blood cultures negative x3 days, follow

## 2021-07-30 NOTE — Assessment & Plan Note (Addendum)
Present on admission with room air O2 sat in the ED of 85%.  Resolved.  Patient currently on room air and maintains O2 sats above 90% with ambulation and at rest. -- Monitor O2 sats and supplement as needed if less than 90% on room air

## 2021-07-30 NOTE — Progress Notes (Signed)
SATURATION QUALIFICATIONS: (This note is used to comply with regulatory documentation for home oxygen)  Patient Saturations on Room Air at Rest = 96  Patient Saturations on Room Air while Ambulating = 94   

## 2021-07-30 NOTE — Assessment & Plan Note (Signed)
Empiric antibiotics were ordered on admission, later discontinued and patient remains clinically stable and improved.  Superimposed bacterial infections in setting of COVID infection are not common.   --Continue to monitor clinically -- Supportive care, Tylenol, Robitussin as needed

## 2021-07-30 NOTE — Assessment & Plan Note (Signed)
Chronic, stable 

## 2021-07-30 NOTE — Assessment & Plan Note (Addendum)
Present on admission.  Tested positive on 07/18/2021 Discontinue isolation precautions. Chest x-ray showed right lower lobe infiltrate. Stop dexamethasone given resolution of hypoxia and steroids  contributing to her delirium and hypertension.

## 2021-07-30 NOTE — Progress Notes (Signed)
°   07/30/21 0800  Level of Consciousness  Level of Consciousness Alert  MEWS COLOR  MEWS Score Color Green  Oxygen Therapy  SpO2 94 %  O2 Device Room Air  Patient Activity (if Appropriate) Ambulating  Pain Assessment  Pain Scale Faces  Faces Pain Scale 0  PCA/Epidural/Spinal Assessment  Respiratory Pattern Regular  ECG Monitoring  Cardiac Rhythm NSR  MEWS Score  MEWS Temp 0  MEWS Systolic 0  MEWS Pulse 0  MEWS RR 0  MEWS LOC 0  MEWS Score 0

## 2021-07-31 LAB — CBC
HCT: 33.4 % — ABNORMAL LOW (ref 36.0–46.0)
Hemoglobin: 11.6 g/dL — ABNORMAL LOW (ref 12.0–15.0)
MCH: 32.2 pg (ref 26.0–34.0)
MCHC: 34.7 g/dL (ref 30.0–36.0)
MCV: 92.8 fL (ref 80.0–100.0)
Platelets: 431 10*3/uL — ABNORMAL HIGH (ref 150–400)
RBC: 3.6 MIL/uL — ABNORMAL LOW (ref 3.87–5.11)
RDW: 11.4 % — ABNORMAL LOW (ref 11.5–15.5)
WBC: 11.7 10*3/uL — ABNORMAL HIGH (ref 4.0–10.5)
nRBC: 0 % (ref 0.0–0.2)

## 2021-07-31 LAB — BASIC METABOLIC PANEL
Anion gap: 9 (ref 5–15)
BUN: 24 mg/dL — ABNORMAL HIGH (ref 8–23)
CO2: 26 mmol/L (ref 22–32)
Calcium: 9.1 mg/dL (ref 8.9–10.3)
Chloride: 97 mmol/L — ABNORMAL LOW (ref 98–111)
Creatinine, Ser: 0.54 mg/dL (ref 0.44–1.00)
GFR, Estimated: >60 mL/min (ref >60)
Glucose, Bld: 158 mg/dL — ABNORMAL HIGH (ref 70–99)
Potassium: 3.8 mmol/L (ref 3.5–5.1)
Sodium: 132 mmol/L — ABNORMAL LOW (ref 135–145)

## 2021-07-31 LAB — PROCALCITONIN: Procalcitonin: 0.15 ng/mL

## 2021-07-31 MED ORDER — GABAPENTIN 100 MG PO CAPS: 100.0000 mg | ORAL_CAPSULE | Freq: Two times a day (BID) | ORAL | 0 refills | Status: DC

## 2021-07-31 MED ORDER — AMLODIPINE BESYLATE 5 MG PO TABS: 5.0000 mg | ORAL_TABLET | Freq: Every day | ORAL | 2 refills | Status: DC

## 2021-07-31 NOTE — Care Management Important Message (Signed)
Important Message  Patient Details  Name: Hannah Dillon MRN: [id number] Date of Birth: 1922-07-26   Medicare Important Message Given:  Yes     Johnell Comings 07/31/2021, 10:29 AM

## 2021-07-31 NOTE — Discharge Summary (Signed)
Physician Discharge Summary   Patient: Hannah Dillon MRN: [id number] DOB: 02/13/1923  Admit date:     07/27/2021  Discharge date: 07/31/21  Discharge Physician: Ezekiel Slocumb   PCP: System, Provider Not In   Recommendations at discharge:    Follow up with PCP in 1-2 weeks CBC/CMP in 1-2 weeks  Discharge Diagnoses Principal Problem:   Acute on chronic respiratory failure with hypoxia (HCC) Active Problems:   CAP (community acquired pneumonia)   COVID-19 virus infection   Sepsis ([place])   Generalized weakness   Degenerative joint disease   Cervical radiculopathy   Chronic pain   Hypertension   Dementia [facility])    Hospital Course   88 year old Caucasian female with a past medical history significant for dementia, cervical radiculopathy, DJD, recent COVID-19 infection (diagnosed on 07/18/2021).  This patient was brought in to the ER by her son for evaluation for weakness and increased lethargy over the past several days.  Of note the patient was seen at local urgent care for evaluation of a cough and nasal congestion and at that time her COVID-19 PCR test was positive.  She was treated with doxycycline, Tessalon Perles and Atrovent nasal spray.  Instead she feels very weak and has become increasingly lethargic.  Found to be saturating 85% on room air in the emergency department.  Admitted for acute hypoxic respiratory failure due to community-acquired pneumonia and COVID-19 infection.  * Acute on chronic respiratory failure with hypoxia ([place])- (present on admission) Present on admission with room air O2 sat in the ED of 85%.  Resolved.  Patient currently on room air and maintains O2 sats above 90% with ambulation and at rest. -- Monitor O2 sats and supplement as needed if less than 90% on room air  Sepsis ([place])- (present on admission) Present on admission, resolved. Patient had tachycardia and leukocytosis in the setting of COVID-19 infection and pneumonia. Sepsis physiology is  resolved. Treated with empiric antibiotics on admission. --Urine culture pending, unclear if collected -- MRSA screen negative -- Blood cultures negative x3 days, follow  COVID-19 virus infection- (present on admission) Present on admission.  Tested positive on 07/18/2021 Discontinue isolation precautions. Chest x-ray showed right lower lobe infiltrate. Stop dexamethasone given resolution of hypoxia and steroids  contributing to her delirium and hypertension.  CAP (community acquired pneumonia)- (present on admission) Empiric antibiotics were ordered on admission, later discontinued and patient remains clinically stable and improved.  Superimposed bacterial infections in setting of COVID infection are not common.   --Continue to monitor clinically -- Supportive care, Tylenol, Robitussin as needed  Generalized weakness PT and OT evaluated and both recommend home health for ongoing rehab.  TOC following. -- Fall precautions  Chronic pain- (present on admission) Due to cervical radiculopathy and DJD.  Continue home gabapentin.  Home Norco and Xtampza ER are on hold.  Patient does not appear to have exacerbated pain at this time. --Continue OxyContin 10 mg twice daily  Cervical radiculopathy- (present on admission) Chronic, stable.  Monitor. -- Continue home gabapentin -- Home opioids are currently on hold  Degenerative joint disease- (present on admission) Chronic, stable  Hypertension- (present on admission) BPs have been quite uncontrolled, requiring as needed IV labetalol. -- Start amlodipine 5 mg daily --IV labetalol as needed -- Discussed home BP monitoring with son, agreeable -- PCP follow-up  Dementia ([place])- (present on admission) Patient appears to have fairly significant dementia.  Oriented to only self.  She intermittently has episodes of increased anxiety and paranoia.   --  Delirium precautions --Continue sitter for safety -- Low-dose Xanax as needed for anxiety,  avoid as much as possible        Consultants: none Procedures performed: none  Disposition: Home Diet recommendation: Regular diet  DISCHARGE MEDICATION: Allergies as of 07/31/2021   No Known Allergies      Medication List     TAKE these medications    acetaminophen 650 MG CR tablet Commonly known as: TYLENOL Take 1,300 mg by mouth every 8 (eight) hours as needed for pain.   amLODipine 5 MG tablet Commonly known as: NORVASC Take 1 tablet (5 mg total) by mouth daily. Start taking on: August 01, 2021   benzonatate 200 MG capsule Commonly known as: TESSALON Take 1 capsule (200 mg total) by mouth 3 (three) times daily as needed for cough.   gabapentin 100 MG capsule Commonly known as: NEURONTIN Take 1 capsule (100 mg total) by mouth 2 (two) times daily. With breakfast and lunch.  Take 2 capsules (200 mg total) at bedtime What changed:  medication strength how much to take when to take this additional instructions   HYDROcodone-acetaminophen 7.5-325 MG tablet Commonly known as: NORCO Take 1 tablet by mouth every 8 (eight) hours as needed.   ipratropium 0.06 % nasal spray Commonly known as: ATROVENT Place 2 sprays into both nostrils 4 (four) times daily.   naloxone 4 MG/0.1ML Liqd nasal spray kit Commonly known as: NARCAN Place 4 mg into the nose once as needed.   Xtampza ER 9 MG C12a Generic drug: oxyCODONE ER Take 1 capsule by mouth at bedtime.               Durable Medical Equipment  (From admission, onward)           Start     Ordered   07/30/21 0755  For home use only DME oxygen  Once       Question Answer Comment  Length of Need 6 Months   Mode or (Route) Nasal cannula   Liters per Minute 2   Frequency Continuous (stationary and portable oxygen unit needed)   Oxygen delivery system Gas      07/30/21 0754             Discharge Exam: Filed Weights   07/28/21 1031  Weight: 56.7 kg   General exam: awake, alert, no acute  distress HEENT: atraumatic, clear conjunctiva, anicteric sclera, moist mucus membranes, hearing grossly normal  Respiratory system: CTAB, no wheezes, rales or rhonchi, normal respiratory effort. Cardiovascular system: normal S1/S2,  RRR, no JVD, murmurs, rubs, gallops,  no pedal edema.   Gastrointestinal system: soft, NT, ND, no HSM felt, +bowel sounds. Central nervous system: no gross focal neurologic deficits, normal speech Extremities: moves all, no edema, normal tone Skin: dry, intact, normal temperature Psychiatry: normal mood, congruent affect, judgement and insight appear normal   Condition at discharge: stable  The results of significant diagnostics from this hospitalization (including imaging, microbiology, ancillary and laboratory) are listed below for reference.   Imaging Studies: DG Chest 2 View  Result Date: 07/27/2021 CLINICAL DATA:  Shortness of breath EXAM: CHEST - 2 VIEW COMPARISON:  12/09/2019 FINDINGS: Stable mild cardiomegaly. Aortic atherosclerosis. Patchy airspace consolidation within the right lower lobe. Small right pleural effusion. Minimal atelectasis in the left lung base. Left lung is otherwise clear. No pneumothorax. IMPRESSION: Right lower lobe pneumonia with small right pleural effusion. Electronically Signed   By: Davina Poke D.O.   On: 07/27/2021 16:30  Microbiology: Results for orders placed or performed during the hospital encounter of 07/27/21  Blood Culture (routine x 2)     Status: None (Preliminary result)   Collection Time: 07/27/21  6:32 PM   Specimen: BLOOD  Result Value Ref Range Status   Specimen Description BLOOD LEFT ANTECUBITAL  Final   Special Requests   Final    BOTTLES DRAWN AEROBIC AND ANAEROBIC Blood Culture results may not be optimal due to an inadequate volume of blood received in culture bottles   Culture   Final    NO GROWTH 4 DAYS Performed at [facility], [address], [place], [place] [address]    Report  Status PENDING  Incomplete  Blood Culture (routine x 2)     Status: None (Preliminary result)   Collection Time: 07/27/21  6:33 PM   Specimen: BLOOD  Result Value Ref Range Status   Specimen Description BLOOD RIGHT ANTECUBITAL  Final   Special Requests   Final    BOTTLES DRAWN AEROBIC AND ANAEROBIC Blood Culture results may not be optimal due to an excessive volume of blood received in culture bottles   Culture   Final    NO GROWTH 4 DAYS Performed at [facility], [address], [place], [place] [address]    Report Status PENDING  Incomplete  Resp Panel by RT-PCR (Flu A&B, Covid) Nasopharyngeal Swab     Status: Abnormal   Collection Time: 07/27/21  6:33 PM   Specimen: Nasopharyngeal Swab; Nasopharyngeal(NP) swabs in vial transport medium  Result Value Ref Range Status   SARS Coronavirus 2 by RT PCR POSITIVE (A) NEGATIVE Final    Comment: (NOTE) SARS-CoV-2 target nucleic acids are DETECTED.  The SARS-CoV-2 RNA is generally detectable in upper respiratory specimens during the acute phase of infection. Positive results are indicative of the presence of the identified virus, but do not rule out bacterial infection or co-infection with other pathogens not detected by the test. Clinical correlation with patient history and other diagnostic information is necessary to determine patient infection status. The expected result is Negative.  Fact Sheet for Patients: EntrepreneurPulse.com.au  Fact Sheet for Healthcare Providers: IncredibleEmployment.be  This test is not yet approved or cleared by the [place] FDA and  has been authorized for detection and/or diagnosis of SARS-CoV-2 by FDA under an Emergency Use Authorization (EUA).  This EUA will remain in effect (meaning this test can be used) for the duration of  the COVID-19 declaration under Section 564(b)(1) of the A ct, 21 U.S.C. section 360bbb-3(b)(1), unless the authorization  is terminated or revoked sooner.     Influenza A by PCR NEGATIVE NEGATIVE Final   Influenza B by PCR NEGATIVE NEGATIVE Final    Comment: (NOTE) The Xpert Xpress SARS-CoV-2/FLU/RSV plus assay is intended as an aid in the diagnosis of influenza from Nasopharyngeal swab specimens and should not be used as a sole basis for treatment. Nasal washings and aspirates are unacceptable for Xpert Xpress SARS-CoV-2/FLU/RSV testing.  Fact Sheet for Patients: EntrepreneurPulse.com.au  Fact Sheet for Healthcare Providers: IncredibleEmployment.be  This test is not yet approved or cleared by the [place] FDA and has been authorized for detection and/or diagnosis of SARS-CoV-2 by FDA under an Emergency Use Authorization (EUA). This EUA will remain in effect (meaning this test can be used) for the duration of the COVID-19 declaration under Section 564(b)(1) of the Act, 21 U.S.C. section 360bbb-3(b)(1), unless the authorization is terminated or revoked.  Performed at [facility], [phone] [place], [place] [address]   MRSA Next Gen by PCR, Nasal     Status: None   Collection Time: 07/28/21  1:08 PM   Specimen: Nasal Mucosa; Nasal Swab  Result Value Ref Range Status   MRSA by PCR Next Gen NOT DETECTED NOT DETECTED Final    Comment: (NOTE) The GeneXpert MRSA Assay (FDA approved for NASAL specimens only), is one component of a comprehensive MRSA colonization surveillance program. It is not intended to diagnose MRSA infection nor to guide or monitor treatment for MRSA infections. Test performance is not FDA approved in patients less than 77 years old. Performed at [facility], [address] [place], [place] [address]     Labs: CBC: Recent Labs  Lab 07/27/21 1555 07/28/21 4196 07/29/21 0443 07/30/21 0338 07/31/21 0441  WBC 26.2* 28.5* 19.2* 15.0* 11.7*  HGB 12.1 11.3* 10.5* 10.9* 11.6*  HCT 35.6* 33.7* 31.9* 32.7*  33.4*  MCV 95.2 94.4 96.1 94.5 92.8  PLT 377 375 372 413* 222*   Basic Metabolic Panel: Recent Labs  Lab 07/27/21 1555 07/28/21 0653 07/29/21 0443 07/30/21 0338 07/31/21 0441  NA 134* 136  --  133* 132*  K 3.6 3.4* 3.7 3.7 3.8  CL 97* 99  --  97* 97*  CO2 26 26  --  28 26  GLUCOSE 160* 136*  --  183* 158*  BUN 27* 19  --  15 24*  CREATININE 0.74 0.57  --  0.42* 0.54  CALCIUM 8.9 8.6*  --  8.8* 9.1   Liver Function Tests: No results for input(s): AST, ALT, ALKPHOS, BILITOT, PROT, ALBUMIN in the last 168 hours. CBG: No results for input(s): GLUCAP in the last 168 hours.  Discharge time spent: less than 30 minutes.  Signed: Ezekiel Slocumb, DO [facility] 07/31/2021

## 2021-07-31 NOTE — TOC Progression Note (Signed)
Transition of Care [facility]) - Progression Note    Patient Details  Name: Hannah Dillon MRN: [id number] Date of Birth: 14-Mar-1923  Transition of Care [facility]) CM/SW Contact  Caryn Section, RN Phone Number: 07/31/2021, 10:45 AM  Clinical Narrative:   Spoke to son [name] who will transport patient at around 1130am.  [facility], DME to deliver to home per son request.    Son can pick up meds at [facility] today and he will provide 24/7 care for his mom.    Expected Discharge Plan: Home w Home Health Services Barriers to Discharge: Continued Medical Work up  Expected Discharge Plan and Services Expected Discharge Plan: Home w Home Health Services       Living arrangements for the past 2 months: Single Family Home Expected Discharge Date: 07/31/21               DME Arranged: 3-N-1 DME Agency: [facility] Date DME Agency Contacted: 07/30/21   Representative spoke with at DME Agency: Leavy Cella HH Arranged: PT HH Agency: [facility] Date HH Agency Contacted: 07/30/21   Representative spoke with at [facility] Agency: Barbara Cower   Social Determinants of Health (SDOH) Interventions    Readmission Risk Interventions No flowsheet data found.

## 2021-08-01 LAB — CULTURE, BLOOD (ROUTINE X 2)
Culture: NO GROWTH
Culture: NO GROWTH

## 2021-08-02 ENCOUNTER — Emergency Department: Payer: Medicare Other

## 2021-08-02 ENCOUNTER — Inpatient Hospital Stay
Admission: EM | Admit: 2021-08-02 | Discharge: 2021-08-05 | DRG: 176 | Disposition: A | Discharge status: Home Health | Payer: Medicare Other | Attending: Family Medicine | Admitting: Family Medicine

## 2021-08-02 ENCOUNTER — Other Ambulatory Visit: Payer: Self-pay

## 2021-08-02 ENCOUNTER — Encounter: Payer: Self-pay | Admitting: Emergency Medicine

## 2021-08-02 DIAGNOSIS — F0150 Vascular dementia without behavioral disturbance: Secondary | ICD-10-CM | POA: Diagnosis present

## 2021-08-02 DIAGNOSIS — I10 Essential (primary) hypertension: Secondary | ICD-10-CM | POA: Diagnosis present

## 2021-08-02 DIAGNOSIS — U071 COVID-19: Secondary | ICD-10-CM | POA: Diagnosis present

## 2021-08-02 DIAGNOSIS — Z66 Do not resuscitate: Secondary | ICD-10-CM | POA: Diagnosis present

## 2021-08-02 DIAGNOSIS — J9621 Acute and chronic respiratory failure with hypoxia: Secondary | ICD-10-CM | POA: Diagnosis present

## 2021-08-02 DIAGNOSIS — I2694 Multiple subsegmental pulmonary emboli without acute cor pulmonale: Secondary | ICD-10-CM

## 2021-08-02 DIAGNOSIS — J189 Pneumonia, unspecified organism: Secondary | ICD-10-CM | POA: Diagnosis present

## 2021-08-02 DIAGNOSIS — F0390 Unspecified dementia without behavioral disturbance: Secondary | ICD-10-CM | POA: Diagnosis not present

## 2021-08-02 DIAGNOSIS — J9601 Acute respiratory failure with hypoxia: Secondary | ICD-10-CM

## 2021-08-02 DIAGNOSIS — R918 Other nonspecific abnormal finding of lung field: Secondary | ICD-10-CM

## 2021-08-02 DIAGNOSIS — I2699 Other pulmonary embolism without acute cor pulmonale: Principal | ICD-10-CM | POA: Diagnosis present

## 2021-08-02 DIAGNOSIS — M1990 Unspecified osteoarthritis, unspecified site: Secondary | ICD-10-CM | POA: Diagnosis present

## 2021-08-02 DIAGNOSIS — R0602 Shortness of breath: Secondary | ICD-10-CM | POA: Diagnosis present

## 2021-08-02 DIAGNOSIS — U099 Post covid-19 condition, unspecified: Secondary | ICD-10-CM | POA: Diagnosis present

## 2021-08-02 LAB — CBC
HCT: 38 % (ref 36.0–46.0)
Hemoglobin: 12.3 g/dL (ref 12.0–15.0)
MCH: 32 pg (ref 26.0–34.0)
MCHC: 32.4 g/dL (ref 30.0–36.0)
MCV: 99 fL (ref 80.0–100.0)
Platelets: 412 10*3/uL — ABNORMAL HIGH (ref 150–400)
RBC: 3.84 MIL/uL — ABNORMAL LOW (ref 3.87–5.11)
RDW: 11.7 % (ref 11.5–15.5)
WBC: 30.5 10*3/uL — ABNORMAL HIGH (ref 4.0–10.5)
nRBC: 0 % (ref 0.0–0.2)

## 2021-08-02 LAB — APTT: aPTT: 25 seconds (ref 24–36)

## 2021-08-02 LAB — BASIC METABOLIC PANEL
Anion gap: 8 (ref 5–15)
BUN: 45 mg/dL — ABNORMAL HIGH (ref 8–23)
CO2: 25 mmol/L (ref 22–32)
Calcium: 8.8 mg/dL — ABNORMAL LOW (ref 8.9–10.3)
Chloride: 101 mmol/L (ref 98–111)
Creatinine, Ser: 0.95 mg/dL (ref 0.44–1.00)
GFR, Estimated: 55 mL/min — ABNORMAL LOW (ref >60)
Glucose, Bld: 139 mg/dL — ABNORMAL HIGH (ref 70–99)
Potassium: 4.6 mmol/L (ref 3.5–5.1)
Sodium: 134 mmol/L — ABNORMAL LOW (ref 135–145)

## 2021-08-02 LAB — PROTIME-INR
INR: 1.1 (ref 0.8–1.2)
Prothrombin Time: 14 seconds (ref 11.4–15.2)

## 2021-08-02 LAB — TROPONIN I (HIGH SENSITIVITY)
Troponin I (High Sensitivity): 13 ng/L (ref <18)
Troponin I (High Sensitivity): 14 ng/L (ref <18)

## 2021-08-02 MED ORDER — SODIUM CHLORIDE 0.9% FLUSH
3.0000 mL | Freq: Two times a day (BID) | INTRAVENOUS | Status: DC
  Administered 2021-08-02 – 2021-08-04 (×4): 3 mL via INTRAVENOUS

## 2021-08-02 MED ORDER — AMLODIPINE BESYLATE 5 MG PO TABS
5.0000 mg | ORAL_TABLET | Freq: Every day | ORAL | Status: DC
  Administered 2021-08-03 – 2021-08-05 (×3): 5 mg via ORAL
  Filled 2021-08-02 (×3): qty 1

## 2021-08-02 MED ORDER — IOHEXOL 350 MG/ML SOLN
75.0000 mL | Freq: Once | INTRAVENOUS | Status: AC | PRN
  Administered 2021-08-02: 75 mL via INTRAVENOUS

## 2021-08-02 MED ORDER — ACETAMINOPHEN 650 MG RE SUPP: 650.0000 mg | Freq: Four times a day (QID) | RECTAL | Status: DC | PRN

## 2021-08-02 MED ORDER — HEPARIN BOLUS VIA INFUSION
4000.0000 [IU] | Freq: Once | INTRAVENOUS | Status: AC
Start: 2021-08-02 — End: 2021-08-02
  Administered 2021-08-02: 4000 [IU] via INTRAVENOUS
  Filled 2021-08-02: qty 4000

## 2021-08-02 MED ORDER — SODIUM CHLORIDE 0.9 % IV SOLN: 250.0000 mL | INTRAVENOUS | Status: DC | PRN

## 2021-08-02 MED ORDER — BENZONATATE 100 MG PO CAPS: 200.0000 mg | ORAL_CAPSULE | Freq: Three times a day (TID) | ORAL | Status: DC | PRN

## 2021-08-02 MED ORDER — VANCOMYCIN HCL IN DEXTROSE 1-5 GM/200ML-% IV SOLN
1000.0000 mg | Freq: Once | INTRAVENOUS | Status: AC
  Administered 2021-08-02: 1000 mg via INTRAVENOUS
  Filled 2021-08-02: qty 200

## 2021-08-02 MED ORDER — SODIUM CHLORIDE 0.9 % IV SOLN
2.0000 g | Freq: Once | INTRAVENOUS | Status: AC
  Administered 2021-08-02: 2 g via INTRAVENOUS
  Filled 2021-08-02: qty 2

## 2021-08-02 MED ORDER — GABAPENTIN 100 MG PO CAPS
100.0000 mg | ORAL_CAPSULE | Freq: Two times a day (BID) | ORAL | Status: DC
Start: 2021-08-03 — End: 2021-08-05
  Administered 2021-08-03 – 2021-08-05 (×6): 100 mg via ORAL
  Filled 2021-08-02 (×6): qty 1

## 2021-08-02 MED ORDER — ACETAMINOPHEN 325 MG PO TABS
650.0000 mg | ORAL_TABLET | Freq: Four times a day (QID) | ORAL | Status: DC | PRN
  Administered 2021-08-03 – 2021-08-04 (×2): 650 mg via ORAL
  Filled 2021-08-02 (×2): qty 2

## 2021-08-02 MED ORDER — HEPARIN (PORCINE) 25000 UT/250ML-% IV SOLN
1000.0000 [IU]/h | INTRAVENOUS | Status: DC
  Administered 2021-08-02: 1000 [IU]/h via INTRAVENOUS
  Filled 2021-08-02: qty 250

## 2021-08-02 MED ORDER — VANCOMYCIN HCL 1250 MG/250ML IV SOLN: 1250.0000 mg | INTRAVENOUS | Status: DC

## 2021-08-02 MED ORDER — IPRATROPIUM BROMIDE 0.06 % NA SOLN
2.0000 | Freq: Four times a day (QID) | NASAL | Status: DC
  Administered 2021-08-03 – 2021-08-05 (×10): 2 via NASAL
  Filled 2021-08-02: qty 15

## 2021-08-02 MED ORDER — SODIUM CHLORIDE 0.9 % IV SOLN: INTRAVENOUS | Status: AC

## 2021-08-02 MED ORDER — GABAPENTIN 100 MG PO CAPS
200.0000 mg | ORAL_CAPSULE | Freq: Every day | ORAL | Status: DC
  Administered 2021-08-03 – 2021-08-04 (×2): 200 mg via ORAL
  Filled 2021-08-02 (×2): qty 2

## 2021-08-02 MED ORDER — HYDROCODONE-ACETAMINOPHEN 7.5-325 MG PO TABS
1.0000 | ORAL_TABLET | Freq: Three times a day (TID) | ORAL | Status: DC | PRN
  Administered 2021-08-03: 1 via ORAL
  Filled 2021-08-02: qty 1

## 2021-08-02 MED ORDER — SODIUM CHLORIDE 0.9% FLUSH: 3.0000 mL | INTRAVENOUS | Status: DC | PRN

## 2021-08-02 MED ORDER — SODIUM CHLORIDE 0.9 % IV SOLN
2.0000 g | INTRAVENOUS | Status: DC
  Administered 2021-08-03: 2 g via INTRAVENOUS
  Filled 2021-08-02 (×2): qty 2

## 2021-08-02 NOTE — H&P (Signed)
History and Physical    Hannah Dillon DOB: Nov 29, 1922 DOA: 08/02/2021  PCP: System, Provider Not In    Patient coming from:  Home    Chief Complaint:  SOB    HPI:  Hannah Dillon is a 88 y.o. female seen in ed with complaints of sob.  Patient was at her physical therapist doing PT and noted that her O2 sat was 85.  Pt was diagnosed with recent Covid -78 on 07/18/2021 and d/c on 07/31/2021 after being diagnosed with pneumonia( pt started on cefepime and vancomycin by edmd) and cta showed PE.   Pt has past medical history of COVID-19, community-acquired pneumonia, sepsis.  ED Course:   Vitals:   08/02/21 2115 08/02/21 2200 08/02/21 2230 08/02/21 2356  BP: (!) 149/61 (!) 107/46 110/64 130/81  Pulse: 63 69 76 61  Resp: 14 20 19 18   Temp:    99.1 F (37.3 C)  TempSrc:      SpO2: 97% 97% 96% 98%  Weight:      Height:      In the emergency room patient is oxygenating 96% blood pressure and heart rate are within normal limits.  Initial EKG was sinus rhythm 74 with left axis deviation and no ST-T wave changes.  Initial chest x-ray shows improvement in right lower lobe pneumonia but new subtle patchy areas in the right upper lobe.  CT angio of the chest shows: Bilateral pulmonary emboli without definitive right heart strain.  Right lower lobe consolidation with small effusion.  Patchy infiltrates are noted within the right upper lobe which may be related to the recent history of COVID. Aortic Atherosclerosis (ICD10-I70.0). In the emergency room patient received cefepime for HCAP, vancomycin for broad-spectrum coverage along with cefepime patient started on heparin infusion for her pulmonary embolism  Review of Systems:  Review of Systems  Unable to perform ROS: Dementia  Respiratory:  Positive for shortness of breath.     Past Medical History:  Diagnosis Date   Arthritis    Cervical radiculopathy    DJD (degenerative joint disease)    Left arm pain      History reviewed. No pertinent surgical history.   reports that she has never smoked. She has never used smokeless tobacco. She reports that she does not drink alcohol and does not use drugs.  No Known Allergies  History reviewed. No pertinent family history.  Prior to Admission medications   Medication Sig Start Date End Date Taking? Authorizing Provider  acetaminophen (TYLENOL) 650 MG CR tablet Take 1,300 mg by mouth every 8 (eight) hours as needed for pain.    [provider]  amLODipine (NORVASC) 5 MG tablet Take 1 tablet (5 mg total) by mouth daily. 08/01/21   Ezekiel Slocumb, DO  benzonatate (TESSALON) 200 MG capsule Take 1 capsule (200 mg total) by mouth 3 (three) times daily as needed for cough. 07/18/21   Laurene Footman B, PA-C  gabapentin (NEURONTIN) 100 MG capsule Take 1 capsule (100 mg total) by mouth 2 (two) times daily. With breakfast and lunch.  Take 2 capsules (200 mg total) at bedtime 07/31/21   Ezekiel Slocumb, DO  HYDROcodone-acetaminophen (NORCO) 7.5-325 MG tablet Take 1 tablet by mouth every 8 (eight) hours as needed. 06/15/21   [provider]  ipratropium (ATROVENT) 0.06 % nasal spray Place 2 sprays into both nostrils 4 (four) times daily. 07/18/21   Danton Clap, PA-C  naloxone [facility]) nasal spray 4 mg/0.1 mL Place 4 mg into  the nose once as needed. 02/23/21   [provider]  XTAMPZA ER 9 MG C12A Take 1 capsule by mouth at bedtime. 06/07/21   [provider]    Physical Exam: Vitals:   08/02/21 2115 08/02/21 2200 08/02/21 2230 08/02/21 2356  BP: (!) 149/61 (!) 107/46 110/64 130/81  Pulse: 63 69 76 61  Resp: 14 20 19 18   Temp:    99.1 F (37.3 C)  TempSrc:      SpO2: 97% 97% 96% 98%  Weight:      Height:       Physical Exam Vitals reviewed.  Constitutional:      Appearance: She is not ill-appearing.  HENT:     Head: Normocephalic and atraumatic.     Right Ear: External ear normal.     Left Ear: External ear  normal.     Nose: Nose normal.     Mouth/Throat:     Mouth: Mucous membranes are moist.  Eyes:     Extraocular Movements: Extraocular movements intact.     Pupils: Pupils are equal, round, and reactive to light.  Cardiovascular:     Rate and Rhythm: Normal rate.     Heart sounds: Normal heart sounds.  Pulmonary:     Effort: Pulmonary effort is normal.     Breath sounds: Normal breath sounds.  Abdominal:     General: Bowel sounds are normal.     Palpations: Abdomen is soft.  Musculoskeletal:     Right lower leg: No edema.     Left lower leg: No edema.  Skin:    General: Skin is warm.  Neurological:     General: No focal deficit present.     Mental Status: She is alert. She is disoriented.  Psychiatric:        Mood and Affect: Mood normal.        Behavior: Behavior normal.     Labs on Admission: I have personally reviewed following labs and imaging studies No results for input(s): CKTOTAL, CKMB, TROPONINI in the last 72 hours. Lab Results  Component Value Date   WBC 30.5 (H) 08/02/2021   HGB 12.3 08/02/2021   HCT 38.0 08/02/2021   MCV 99.0 08/02/2021   PLT 412 (H) 08/02/2021    Recent Labs  Lab 08/02/21 1844  NA 134*  K 4.6  CL 101  CO2 25  BUN 45*  CREATININE 0.95  CALCIUM 8.8*  GLUCOSE 139*   No results found for: CHOL, HDL, LDLCALC, TRIG No results found for: DDIMER Invalid input(s): POCBNP   COVID-19 Labs No results for input(s): DDIMER, FERRITIN, LDH, CRP in the last 72 hours. Lab Results  Component Value Date   SARSCOV2NAA POSITIVE (A) 07/27/2021   SARSCOV2NAA POSITIVE (A) 07/18/2021    Radiological Exams on Admission: DG Chest 2 View  Result Date: 08/02/2021 CLINICAL DATA:  Provided history: Low oxygen saturation. Additional history provided: Patient diagnosed with pneumonia, discharge from the hospital on Monday, tested positive for COVID 07/27/2021. EXAM: CHEST - 2 VIEW COMPARISON:  Prior chest radiographs 07/27/2021 and earlier. FINDINGS:  Mild cardiomegaly, unchanged. Aortic atherosclerosis. Although improved from the prior examination of 07/27/2021, there is persistent airspace disease within the right lung base. Additionally, there is suggestion of subtle patchy airspace disease within the right upper lobe, a finding not appreciated on the prior chest radiographs. Redemonstrated chronic elevation of the left hemidiaphragm. Subtle blunting of the right lateral costophrenic angle, which may reflect a trace right pleural effusion. No evidence  of pneumothorax. No acute bony abnormality identified. Degenerative changes of the spine. IMPRESSION: Right lower lobe airspace disease, persistent although improved as compared to the prior chest radiographs of 07/27/2021. Radiographic follow-up to complete resolution recommended. Suggestion of subtle patchy airspace disease within the right upper lobe, a new finding. Cardiomegaly, unchanged. Possible trace right pleural effusion. Aortic Atherosclerosis (ICD10-I70.0). Electronically Signed   By: Kellie Simmering D.O.   On: 08/02/2021 18:46   CT Angio Chest Pulmonary Embolism (PE) W or WO Contrast  Result Date: 08/02/2021 CLINICAL DATA:  Chest pain and shortness of breath with respiratory failure and hypoxia, history of COVID-19 positivity, initial encounter EXAM: CT ANGIOGRAPHY CHEST WITH CONTRAST TECHNIQUE: Multidetector CT imaging of the chest was performed using the standard protocol during bolus administration of intravenous contrast. Multiplanar CT image reconstructions and MIPs were obtained to evaluate the vascular anatomy. RADIATION DOSE REDUCTION: This exam was performed according to the departmental dose-optimization program which includes automated exposure control, adjustment of the mA and/or kV according to patient size and/or use of iterative reconstruction technique. CONTRAST:  57mL OMNIPAQUE IOHEXOL 350 MG/ML SOLN COMPARISON:  Chest x-ray from earlier in the same day. FINDINGS: Cardiovascular:  Atherosclerotic calcifications of the thoracic aorta are noted. No aneurysmal dilatation or dissection is noted. Mild cardiac enlargement is noted. Coronary calcifications are seen. The pulmonary artery shows a normal branching pattern bilaterally. Filling defects are noted within the left lower lobe pulmonary arterial branches consistent with pulmonary emboli. Smaller emboli are noted involving the right middle lobe as well. No findings of right heart strain are noted. Mediastinum/Nodes: Thoracic inlet is within normal limits. No sizable hilar or mediastinal adenopathy is noted. The esophagus as visualized is within normal limits. Lungs/Pleura: Lungs are well aerated bilaterally. Mild atelectatic changes are noted in the left lower lobe. Consolidation in the right lower lobe is noted with associated pleural effusion. Patchy infiltrates are noted within the right upper lobe as well. Upper Abdomen: Visualized upper abdomen is within normal limits. Musculoskeletal: No acute rib abnormality is noted. Degenerative changes of the thoracic spine are seen. Review of the MIP images confirms the above findings. IMPRESSION: Bilateral pulmonary emboli without definitive right heart strain. Right lower lobe consolidation with small effusion. Patchy infiltrates are noted within the right upper lobe which may be related to the recent history of COVID. Aortic Atherosclerosis (ICD10-I70.0). Critical Value/emergent results were called by telephone at the time of interpretation on 08/02/2021 at 9:12 pm to Dr. Kerman Passey , who verbally acknowledged these results. Electronically Signed   By: Inez Catalina M.D.   On: 08/02/2021 21:15    EKG: Independently reviewed.   sinus rhythm 74, LAD, artifacts   Assessment/Plan: Principal Problem:   SOB (shortness of breath) Active Problems:   Acute on chronic respiratory failure with hypoxia (HCC)   Pulmonary embolism (HCC)   CAP (community acquired pneumonia)   Hypertension    Dementia (HCC)   Shortness of breath/chronic respiratory failure with hypoxia/PE/CAP: Plan admit patient to [facility] telemetry. Provide supportive care with supplemental oxygen for goal sats high 80s to 90s. Patient continued on heparin for PE and antibiotics for pneumonia with pharmacy consult. Continue Albuterol MDI and duoneb. Patient had COVID diagnosed on January 3 patient Covid isolation.   Hypertension: Blood pressure 130/81, pulse 61, temperature 99.1 F (37.3 C), resp. rate 18, height 5\' 4"  (1.626 m), weight 56.7 kg, SpO2 98 %. Continue home regimen of amlodipine  Dementia: Findings see intermittently disoriented.  Patient is a 88 year old suspect underlying  dementia.   Leukocytosis: Blood cultures collected in the ED. Continue with vancomycin and cefepime per pharmacy protocol for possible HCAP associated pneumonia-procalcitonin, pending.   DVT prophylaxis:  Heparin GTT  Code Status:  Full code  Family Communication:  Jahnyla, [place] (POA) (Son)  [phone] (Home Phone)   Disposition Plan:  Home  Consults called:  None   Admission status: Inpatient.     Medical Decision Making   Coding    Para Skeans MD [facility]  6 PM- 2 AM. Please contact me via secure Chat 6 PM-2 AM. To contact the [facility] Attending or Consulting provider [place] or covering provider during after hours [place], for this patient.   Check the care team in [facility] and look for a) attending/consulting [facility] provider listed and b) the [facility] team listed Log into [email or web address] and use [place] universal password to access. If you do not have the password, please contact the hospital operator. Locate the [facility] provider you are looking for under [facility] and page to a number that you can be directly reached. If you still have difficulty reaching the provider, please page the [facility] (Director on Call) for the Hospitalists listed on amion for assistance. [email or web address] 08/03/2021,  1:21 AM

## 2021-08-02 NOTE — ED Provider Notes (Signed)
----------------------------------------- °  9:23 PM on 08/02/2021 ----------------------------------------- Patient seen and evaluated in conjunction with physician assistant [facility].   I reviewed the patient's CT images appears to have right-sided pneumonia/consolidation.  Radiology has read the CT scan showing small bilateral pulmonary emboli.  They also show a right lower lobe consolidation with effusion.  Patient's white blood cell count is elevated I have reviewed the discharge summary I do not see any sign of steroids being prescribed to the patient.  Given the elevated white blood cell count and CT findings concerning for pneumonia we will cover with IV antibiotics and send blood cultures.  Given the bilateral pulmonary emboli although small we will start the patient on heparin.  Patient will require admission to the hospital service for further treatment. CRITICAL CARE Performed by: Minna Antis   Total critical care time: 30 minutes  Critical care time was exclusive of separately billable procedures and treating other patients.  Critical care was necessary to treat or prevent imminent or life-threatening deterioration.  Critical care was time spent personally by me on the following activities: development of treatment plan with patient and/or surrogate as well as nursing, discussions with consultants, evaluation of patient's response to treatment, examination of patient, obtaining history from patient or surrogate, ordering and performing treatments and interventions, ordering and review of laboratory studies, ordering and review of radiographic studies, pulse oximetry and re-evaluation of patient's condition.    Minna Antis, MD 08/02/21 2124

## 2021-08-02 NOTE — Progress Notes (Signed)
ANTICOAGULATION CONSULT NOTE - Initial Consult  Pharmacy Consult for Heparin drip Indication: pulmonary embolus  No Known Allergies  Patient Measurements: Height: 5\' 4"  (162.6 cm) Weight: 56.7 kg (125 lb) IBW/kg (Calculated) : 54.7 Heparin Dosing Weight: 56.7 kg  Vital Signs: Temp: 98.6 F (37 C) (01/18 1751) Temp Source: Oral (01/18 1751) BP: 149/61 (01/18 2115) Pulse Rate: 63 (01/18 2115)  Labs: Recent Labs    07/31/21 0441 08/02/21 1844 08/02/21 2032 08/02/21 2146  HGB 11.6* 12.3  --   --   HCT 33.4* 38.0  --   --   PLT 431* 412*  --   --   APTT  --   --   --  25  LABPROT  --   --   --  14.0  INR  --   --   --  1.1  CREATININE 0.54 0.95  --   --   TROPONINIHS  --  13 14  --     Estimated Creatinine Clearance: 29.9 mL/min (by C-G formula based on SCr of 0.95 mg/dL).   Medical History: Past Medical History:  Diagnosis Date   Arthritis    Cervical radiculopathy    DJD (degenerative joint disease)    Left arm pain     Medications:  Scheduled:  Infusions:   ceFEPime (MAXIPIME) IV 2 g (08/02/21 2150)   [START ON 08/03/2021] ceFEPime (MAXIPIME) IV     heparin 1,000 Units/hr (08/02/21 2212)   vancomycin     [START ON 08/04/2021] vancomycin      Assessment: 88 yo F to start Heparin drip for +PE No anticoagulation PTA per Med Rec Hgb 12.3   plt 412  INR 1.1  aPTT 25  Goal of Therapy:  Heparin level 0.3-0.7 units/ml Monitor platelets by anticoagulation protocol: Yes   Plan:  Give 4000 units bolus x 1 Start heparin infusion at 1000 units/hr Check anti-Xa level in 8 hours and daily while on heparin Continue to monitor H&H and platelets  Solei Wubben A 08/02/2021,10:16 PM

## 2021-08-02 NOTE — ED Triage Notes (Signed)
Pt to ED via [place] with her son, her son reports that she was doing physical therapy and her O2 was 85%. She was just diagnosised with pneumonia and was just discharged from the hospital on Monday for the same

## 2021-08-02 NOTE — Progress Notes (Signed)
Pharmacy Antibiotic Note  Hannah Dillon is a 88 y.o. female admitted on 08/02/2021 with pneumonia.  Pharmacy has been consulted for vancomycin and cefepime dosing.  Plan: Patient received Cefepime 2 gm IV x 1 in ED and to receive Vancomycin 1 gram IV x1 for loading dose -continue with Cefepime 2 gm IV q24h   crcl 29.9 ml/min  -Vancomycin 1250 mg IV Q 48 hrs. Goal AUC 400-550. Expected AUC: 523 SCr used: 0.95 Cmin 10.4  F/u Scr, MRSA PCR    Height: 5\' 4"  (162.6 cm) Weight: 56.7 kg (125 lb) IBW/kg (Calculated) : 54.7  Temp (24hrs), Avg:98.6 F (37 C), Min:98.6 F (37 C), Max:98.6 F (37 C)  Recent Labs  Lab 07/27/21 1555 07/27/21 1855 07/27/21 2125 07/28/21 0653 07/29/21 0443 07/30/21 0338 07/31/21 0441 08/02/21 1844  WBC 26.2*  --   --  28.5* 19.2* 15.0* 11.7* 30.5*  CREATININE 0.74  --   --  0.57  --  0.42* 0.54 0.95  LATICACIDVEN  --  1.3 1.9  --   --   --   --   --     Estimated Creatinine Clearance: 29.9 mL/min (by C-G formula based on SCr of 0.95 mg/dL).    No Known Allergies  Antimicrobials this admission: vanc 1/18 >>   Cefepime 1/18 >>    Dose adjustments this admission:    Microbiology results: 1/18 BCx: pending   UCx:      Sputum:    1/18 MRSA PCR: pending 1/18 CT chest: Patchy infiltrates are noted within the right upper lobe   Thank you for allowing pharmacy to be a part of this patients care.  Amato Sevillano A 08/02/2021 10:11 PM

## 2021-08-02 NOTE — Progress Notes (Signed)
PHARMACY -  BRIEF ANTIBIOTIC NOTE   Pharmacy has received consult(s) for Vancomycin and Cefepime from an ED provider.  The patient's profile has been reviewed for ht/wt/allergies/indication/available labs.    One time order(s) placed by MD for Vancomycin 1 gram and cefepime 2 gm  Further antibiotics/pharmacy consults should be ordered by admitting physician if indicated.                       Thank you, Kealy Lewter A 08/02/2021  9:37 PM

## 2021-08-02 NOTE — ED Notes (Signed)
Patient oriented to name only. Repeatedly states, "I'm going to heaven. They don't believe me but I am going to go. I want to go to heaven and see my mommy and daddy. Give me a kiss." Patient pulling at RN's hand and attempting to kiss hand and arm. Patient's son at bedside and states patient is DNR. Patient with weak cough. Resp even, unlabored on 2L per [place]. Follows commands. Placed in patient gown and purewick external catheter applied.

## 2021-08-02 NOTE — ED Provider Notes (Signed)
[facility] Provider Note    Event Date/Time   First MD Initiated Contact with Patient 08/02/21 1816     (approximate)   History   Pneumonia   HPI  Tippi Mccrae is a 88 y.o. female with a history of dementia, and acute on chronic respiratory failure with hypoxia, recent community-acquired pneumonia, and remaining history as listed presents to the emergency department for treatment and evaluation of shortness of breath.  She tested positive for COVID-19 on July 18, 2021.  While doing her physical therapy today her oxygen saturation dropped to 85%.  She does not have home oxygen requirement.  She was discharged on July 31, 2021 after being diagnosed with pneumonia.  No fever.      Physical Exam   Triage Vital Signs: ED Triage Vitals  Enc Vitals Group     BP 08/02/21 1751 (!) 130/58     Pulse Rate 08/02/21 1751 77     Resp 08/02/21 1751 17     Temp 08/02/21 1751 98.6 F (37 C)     Temp Source 08/02/21 1751 Oral     SpO2 08/02/21 1751 (!) 85 %     Weight 08/02/21 1753 125 lb (56.7 kg)     Height 08/02/21 1753 5\' 4"  (1.626 m)     Head Circumference --      Peak Flow --      Pain Score --      Pain Loc --      Pain Edu? --      Excl. in GC? --     Most recent vital signs: Vitals:   08/02/21 1930 08/02/21 1945  BP: (!) 129/59   Pulse: 72 68  Resp: 16 16  Temp:    SpO2: 96% 92%    General: Awake, no distress.  CV:  Good peripheral perfusion.  Resp:  Normal effort.  Abd:  No distention.  Other:     ED Results / Procedures / Treatments   Labs (all labs ordered are listed, but only abnormal results are displayed) Labs Reviewed  BASIC METABOLIC PANEL - Abnormal; Notable for the following components:      Result Value   Sodium 134 (*)    Glucose, Bld 139 (*)    BUN 45 (*)    Calcium 8.8 (*)    GFR, Estimated 55 (*)    All other components within normal limits  CBC - Abnormal; Notable for the following components:   WBC 30.5  (*)    RBC 3.84 (*)    Platelets 412 (*)    All other components within normal limits  CULTURE, BLOOD (ROUTINE X 2)  CULTURE, BLOOD (ROUTINE X 2)  TROPONIN I (HIGH SENSITIVITY)  TROPONIN I (HIGH SENSITIVITY)     EKG  ED ECG REPORT I, Maclovia Uher, FNP-BC personally viewed and interpreted this ECG.   Date: 08/02/2021  EKG Time: 1753  Rate: 74  Rhythm: normal EKG, normal sinus rhythm, left axis deviation  Axis: left  Intervals:none  ST&T Change: none    RADIOLOGY Image and radiology report reviewed by me.  Chest x-ray does show some interval improvement of right lower lobe pneumonia.  New subtle patchy area in the right upper lobe.   PROCEDURES:  Critical Care performed: Yes.  Procedures   MEDICATIONS ORDERED IN ED: Medications - No data to display   IMPRESSION / MDM / ASSESSMENT AND PLAN / ED COURSE  I reviewed the triage vital signs and the nursing  notes.  Differential diagnosis includes, but is not limited to, COVID, COVID-pneumonia, pneumonia, PE, respiratory failure  88 year old female presenting to the emergency department for treatment and evaluation after becoming hypoxic today during physical therapy.  She was also noted to be hypoxic at 85% on room air in triage.  She is afebrile.  She is not tachycardic.  She has a leukocytosis of 30.5 in comparison to 11.72 days ago.  She did receive dexamethasone while admitted which may be underlying source.  Initial troponin is normal at 13.  BUN is now 45 with a GFR of 55 in comparison to a BUN of 24 and greater than 60 GFR 2 days ago.  Due to her hypoxia and now oxygen requirement, will obtain CT of the chest to rule out PE.  She will likely require readmission.  Care transferred to Dr. Lenard Lance who will follow up on CT results.     FINAL CLINICAL IMPRESSION(S) / ED DIAGNOSES   Final diagnoses:  Acute respiratory failure with hypoxia (HCC)  Multifocal pneumonia     Rx / DC Orders   ED Discharge  Orders     None        Note:  This document was prepared using Dragon voice recognition software and may include unintentional dictation errors.   Chinita Pester, FNP 08/02/21 2005    Minna Antis, MD 08/02/21 [phone]

## 2021-08-02 NOTE — ED Notes (Signed)
Patient states, "I want to go to heaven and see Jesus. Let me kiss you." No distress noted at this time. APTT and PT-INR sent to lab.

## 2021-08-03 ENCOUNTER — Encounter: Payer: Self-pay | Admitting: Internal Medicine

## 2021-08-03 LAB — D-DIMER, QUANTITATIVE: D-Dimer, Quant: 2.9 ug/mL-FEU — ABNORMAL HIGH (ref 0.00–0.50)

## 2021-08-03 LAB — C-REACTIVE PROTEIN: CRP: 5.2 mg/dL — ABNORMAL HIGH (ref <1.0)

## 2021-08-03 LAB — PROCALCITONIN
Procalcitonin: 0.23 ng/mL
Procalcitonin: 0.2 ng/mL

## 2021-08-03 LAB — CBC WITH DIFFERENTIAL/PLATELET
Abs Immature Granulocytes: 0.46 10*3/uL — ABNORMAL HIGH (ref 0.00–0.07)
Basophils Absolute: 0.1 10*3/uL (ref 0.0–0.1)
Basophils Relative: 0 %
Eosinophils Absolute: 0.1 10*3/uL (ref 0.0–0.5)
Eosinophils Relative: 0 %
HCT: 35.3 % — ABNORMAL LOW (ref 36.0–46.0)
Hemoglobin: 11.8 g/dL — ABNORMAL LOW (ref 12.0–15.0)
Immature Granulocytes: 2 %
Lymphocytes Relative: 5 %
Lymphs Abs: 1.2 10*3/uL (ref 0.7–4.0)
MCH: 32.1 pg (ref 26.0–34.0)
MCHC: 33.4 g/dL (ref 30.0–36.0)
MCV: 95.9 fL (ref 80.0–100.0)
Monocytes Absolute: 1.3 10*3/uL — ABNORMAL HIGH (ref 0.1–1.0)
Monocytes Relative: 6 %
Neutro Abs: 19.9 10*3/uL — ABNORMAL HIGH (ref 1.7–7.7)
Neutrophils Relative %: 87 %
Platelets: 457 10*3/uL — ABNORMAL HIGH (ref 150–400)
RBC: 3.68 MIL/uL — ABNORMAL LOW (ref 3.87–5.11)
RDW: 11.4 % — ABNORMAL LOW (ref 11.5–15.5)
WBC: 23 10*3/uL — ABNORMAL HIGH (ref 4.0–10.5)
nRBC: 0 % (ref 0.0–0.2)

## 2021-08-03 LAB — CBC
HCT: 34.8 % — ABNORMAL LOW (ref 36.0–46.0)
Hemoglobin: 11.5 g/dL — ABNORMAL LOW (ref 12.0–15.0)
MCH: 31.7 pg (ref 26.0–34.0)
MCHC: 33 g/dL (ref 30.0–36.0)
MCV: 95.9 fL (ref 80.0–100.0)
Platelets: 444 10*3/uL — ABNORMAL HIGH (ref 150–400)
RBC: 3.63 MIL/uL — ABNORMAL LOW (ref 3.87–5.11)
RDW: 11.6 % (ref 11.5–15.5)
WBC: 26 10*3/uL — ABNORMAL HIGH (ref 4.0–10.5)
nRBC: 0 % (ref 0.0–0.2)

## 2021-08-03 LAB — COMPREHENSIVE METABOLIC PANEL
ALT: 12 U/L (ref 0–44)
AST: 12 U/L — ABNORMAL LOW (ref 15–41)
Albumin: 3.1 g/dL — ABNORMAL LOW (ref 3.5–5.0)
Alkaline Phosphatase: 52 U/L (ref 38–126)
Anion gap: 10 (ref 5–15)
BUN: 32 mg/dL — ABNORMAL HIGH (ref 8–23)
CO2: 27 mmol/L (ref 22–32)
Calcium: 8.6 mg/dL — ABNORMAL LOW (ref 8.9–10.3)
Chloride: 98 mmol/L (ref 98–111)
Creatinine, Ser: 0.67 mg/dL (ref 0.44–1.00)
GFR, Estimated: >60 mL/min (ref >60)
Glucose, Bld: 122 mg/dL — ABNORMAL HIGH (ref 70–99)
Potassium: 3.6 mmol/L (ref 3.5–5.1)
Sodium: 135 mmol/L (ref 135–145)
Total Bilirubin: 0.7 mg/dL (ref 0.3–1.2)
Total Protein: 6.5 g/dL (ref 6.5–8.1)

## 2021-08-03 LAB — HEPARIN LEVEL (UNFRACTIONATED)
Heparin Unfractionated: 0.51 IU/mL (ref 0.30–0.70)
Heparin Unfractionated: 0.12 IU/mL — ABNORMAL LOW (ref 0.30–0.70)
Heparin Unfractionated: 0.76 IU/mL — ABNORMAL HIGH (ref 0.30–0.70)

## 2021-08-03 LAB — PHOSPHORUS: Phosphorus: 2.6 mg/dL (ref 2.5–4.6)

## 2021-08-03 LAB — MRSA NEXT GEN BY PCR, NASAL: MRSA by PCR Next Gen: NOT DETECTED

## 2021-08-03 LAB — MAGNESIUM: Magnesium: 2 mg/dL (ref 1.7–2.4)

## 2021-08-03 LAB — CREATININE, SERUM
Creatinine, Ser: 0.75 mg/dL (ref 0.44–1.00)
GFR, Estimated: >60 mL/min (ref >60)

## 2021-08-03 MED ORDER — IPRATROPIUM-ALBUTEROL 0.5-2.5 (3) MG/3ML IN SOLN
3.0000 mL | Freq: Two times a day (BID) | RESPIRATORY_TRACT | Status: DC
  Administered 2021-08-04 – 2021-08-05 (×3): 3 mL via RESPIRATORY_TRACT
  Filled 2021-08-03 (×4): qty 3

## 2021-08-03 MED ORDER — IPRATROPIUM-ALBUTEROL 0.5-2.5 (3) MG/3ML IN SOLN
3.0000 mL | Freq: Four times a day (QID) | RESPIRATORY_TRACT | Status: DC
  Administered 2021-08-03: 3 mL via RESPIRATORY_TRACT
  Filled 2021-08-03: qty 3

## 2021-08-03 MED ORDER — HEPARIN (PORCINE) 25000 UT/250ML-% IV SOLN
1000.0000 [IU]/h | INTRAVENOUS | Status: DC
  Administered 2021-08-03: 1100 [IU]/h via INTRAVENOUS
  Filled 2021-08-03: qty 250

## 2021-08-03 MED ORDER — QUETIAPINE FUMARATE 25 MG PO TABS
12.5000 mg | ORAL_TABLET | Freq: Two times a day (BID) | ORAL | Status: AC
  Administered 2021-08-03 (×2): 12.5 mg via ORAL
  Filled 2021-08-03 (×2): qty 1

## 2021-08-03 MED ORDER — ALBUTEROL SULFATE HFA 108 (90 BASE) MCG/ACT IN AERS
1.0000 | INHALATION_SPRAY | RESPIRATORY_TRACT | Status: DC | PRN
  Filled 2021-08-03: qty 6.7

## 2021-08-03 MED ORDER — HALOPERIDOL LACTATE 5 MG/ML IJ SOLN
1.0000 mg | Freq: Once | INTRAMUSCULAR | Status: AC
  Administered 2021-08-03: 1 mg via INTRAVENOUS
  Filled 2021-08-03: qty 1

## 2021-08-03 MED ORDER — HEPARIN BOLUS VIA INFUSION
1500.0000 [IU] | Freq: Once | INTRAVENOUS | Status: AC
  Administered 2021-08-03: 1500 [IU] via INTRAVENOUS
  Filled 2021-08-03: qty 1500

## 2021-08-03 NOTE — Progress Notes (Signed)
ANTICOAGULATION CONSULT NOTE - Initial Consult  Pharmacy Consult for Heparin drip Indication: pulmonary embolus  No Known Allergies  Patient Measurements: Height: 5\' 4"  (162.6 cm) Weight: 56.8 kg (125 lb 3.5 oz) IBW/kg (Calculated) : 54.7 Heparin Dosing Weight: 56.7 kg  Vital Signs: Temp: 97.9 F (36.6 C) (01/19 0414) BP: 169/80 (01/19 0414) Pulse Rate: 72 (01/19 0414)  Labs: Recent Labs    08/02/21 1844 08/02/21 2032 08/02/21 2146 08/03/21 0153 08/03/21 0622  HGB 12.3  --   --  11.5* 11.8*  HCT 38.0  --   --  34.8* 35.3*  PLT 412*  --   --  444* 457*  APTT  --   --  25  --   --   LABPROT  --   --  14.0  --   --   INR  --   --  1.1  --   --   HEPARINUNFRC  --   --   --   --  0.51  CREATININE 0.95  --   --  0.75  --   TROPONINIHS 13 14  --   --   --      Estimated Creatinine Clearance: 35.5 mL/min (by C-G formula based on SCr of 0.75 mg/dL).   Medical History: Past Medical History:  Diagnosis Date   Arthritis    Cervical radiculopathy    DJD (degenerative joint disease)    Left arm pain     Medications:  Scheduled:   amLODipine  5 mg Oral Daily   gabapentin  100 mg Oral BID WC   gabapentin  200 mg Oral QHS   ipratropium  2 spray Each Nare QID   ipratropium-albuterol  3 mL Nebulization BID   sodium chloride flush  3 mL Intravenous Q12H   Infusions:   sodium chloride     sodium chloride 50 mL/hr at 08/03/21 0304   ceFEPime (MAXIPIME) IV     heparin 1,000 Units/hr (08/03/21 0304)   [START ON 08/04/2021] vancomycin      Assessment: 88 yo F to start Heparin drip for +PE No anticoagulation PTA per Med Rec Hgb 12.3   plt 412  INR 1.1  aPTT 25  Goal of Therapy:  Heparin level 0.3-0.7 units/ml Monitor platelets by anticoagulation protocol: Yes   Plan:  1/19:  HL @ 0622 = 0.51, therapeutic X 1 Will continue pt on current rate and draw confirmation level on 1/19 @ 1400.  Prerna Harold D 08/03/2021,7:20 AM

## 2021-08-03 NOTE — Progress Notes (Signed)
OT Cancellation Note  Patient Details Name: Hannah Dillon MRN: [id number] DOB: 1922/09/11   Cancelled Treatment:    Reason Eval/Treat Not Completed: Medical issues which prohibited therapy OT consult received and chart reviewed. Pt found to have PE on CTA of chest. Per therapy protocol, it is optimal to hold for 48h post-initiation of anticoagulation therapy. Chart indicates that pt was started on heparin at 2213 on 08/02/21. Will continue to follow and look for opportune time to initiate therapy services. Thank you.  Rejeana Brock, MS, OTR/L ascom [phone] 08/03/21, 8:49 AM

## 2021-08-03 NOTE — Plan of Care (Signed)

## 2021-08-03 NOTE — Progress Notes (Addendum)
PROGRESS NOTE  Hannah Dillon [id number] DOB: 17-Mar-1923 DOA: 08/02/2021 PCP: System, Provider Not In  HPI/Recap of past 24 hours: Hannah Dillon is a 88 y.o. female seen in ed with complaints of sudden onset shortness of breath noted when she was doing physical therapy.  Associated with hypoxia with O2 saturation of 85% on room air.  Pt was diagnosed with recent Covid -19 on 07/18/2021 and d/c on 07/31/2021.  Work-up revealed pulmonary embolism seen on CT scan.  She was started on heparin drip.  08/03/2021: Patient was seen and examined at bedside.  Denies any chest pain.  No hemoptysis noted.  She is alert and interactive.  On heparin drip.    Assessment/Plan: Principal Problem:   SOB (shortness of breath) Active Problems:   Acute on chronic respiratory failure with hypoxia (HCC)   CAP (community acquired pneumonia)   Hypertension   Dementia ([place])   Pulmonary embolism (HCC)  Acute hypoxic respiratory failure secondary to bilateral pulmonary embolism and HCAP, POA Presented with sudden onset shortness of breath while doing physical therapy O2 saturation in the mid 80s on room air. Embolism seen on CT scan, no clear evidence of right heart strain.  Started on heparin drip on admission Obtain 2D echo Continue heparin drip x48 hours. Switch to Eliquis if no procedures planned or when indicated Maintaining oxygen saturation greater than 92%.  Bilateral pulmonary embolism seen on CT scan no clear evidence of right heart strain. Follow 2D echo Continue heparin drip x48 hours, then switch to Eliquis when time is indicated.  HCAP, multifocal Personally reviewed CTA chest which shows bilateral pulmonary infiltrates right greater than left. She was started on cefepime and IV vancomycin empirically Obtain MRSA screening test if negative DC IV vancomycin Monitor fever curve and WBC  Physical debility PT OT to assess Fall precautions  Essential hypertension, not at goal,  elevated. Continue home amlodipine  Delirium precautions Regulate sleep and wake cycle Melatonin nightly   Critical care time: 65 minutes.   Code Status: DNR  Family Communication: None at bedside  Disposition Plan: Likely will discharge to home with home health services   Consultants: None  Procedures: 2D echo 08/03/2021.  Antimicrobials: Cefepime  DVT prophylaxis: Heparin drip  Status is: Inpatient  Patient requires at least 2 midnights for further evaluation and treatment of present condition      Objective: Vitals:   08/03/21 0229 08/03/21 0414 08/03/21 0740 08/03/21 1104  BP:  (!) 169/80 (!) 182/81 (!) 159/72  Pulse:  72 73 79  Resp:  20 18 18   Temp:  97.9 F (36.6 C) 98.7 F (37.1 C) 98.8 F (37.1 C)  TempSrc:      SpO2: 93% 95% 95% 97%  Weight:  56.8 kg    Height:        Intake/Output Summary (Last 24 hours) at 08/03/2021 1426 Last data filed at 08/03/2021 0409 Gross per 24 hour  Intake 474.6 ml  Output 0 ml  Net 474.6 ml   Filed Weights   08/02/21 1753 08/03/21 0414  Weight: 56.7 kg 56.8 kg    Exam:  General: 88 y.o. year-old female well developed well nourished in no acute distress.  Alert and interactive. Cardiovascular: Regular rate and rhythm with no rubs or gallops.  No thyromegaly or JVD noted.   Respiratory: Mild rales at bases no wheezing noted.  Poor respiratory effort. Abdomen: Soft nontender nondistended with normal bowel sounds x4 quadrants. Musculoskeletal: No lower extremity edema bilaterally.   Skin: No  ulcerative lesions noted or rashes Psychiatry: Mood is appropriate for condition and setting Neuro: Moves all 4 extremities.  Nonfocal exam.   Data Reviewed: CBC: Recent Labs  Lab 07/30/21 0338 07/31/21 0441 08/02/21 1844 08/03/21 0153 08/03/21 0622  WBC 15.0* 11.7* 30.5* 26.0* 23.0*  NEUTROABS  --   --   --   --  19.9*  HGB 10.9* 11.6* 12.3 11.5* 11.8*  HCT 32.7* 33.4* 38.0 34.8* 35.3*  MCV 94.5 92.8 99.0  95.9 95.9  PLT 413* 431* 412* 444* [id number]*   Basic Metabolic Panel: Recent Labs  Lab 07/28/21 0653 07/29/21 0443 07/30/21 0338 07/31/21 0441 08/02/21 1844 08/03/21 0153 08/03/21 0622  NA 136  --  133* 132* 134*  --  135  K 3.4* 3.7 3.7 3.8 4.6  --  3.6  CL 99  --  97* 97* 101  --  98  CO2 26  --  28 26 25   --  27  GLUCOSE 136*  --  183* 158* 139*  --  122*  BUN 19  --  15 24* 45*  --  32*  CREATININE 0.57  --  0.42* 0.54 0.95 0.75 0.67  CALCIUM 8.6*  --  8.8* 9.1 8.8*  --  8.6*  MG  --   --   --   --   --   --  2.0  PHOS  --   --   --   --   --   --  2.6   GFR: Estimated Creatinine Clearance: 35.5 mL/min (by C-G formula based on SCr of 0.67 mg/dL). Liver Function Tests: Recent Labs  Lab 08/03/21 0622  AST 12*  ALT 12  ALKPHOS 52  BILITOT 0.7  PROT 6.5  ALBUMIN 3.1*   No results for input(s): LIPASE, AMYLASE in the last 168 hours. No results for input(s): AMMONIA in the last 168 hours. Coagulation Profile: Recent Labs  Lab 07/27/21 1833 08/02/21 2146  INR 1.1 1.1   Cardiac Enzymes: No results for input(s): CKTOTAL, CKMB, CKMBINDEX, TROPONINI in the last 168 hours. BNP (last 3 results) No results for input(s): PROBNP in the last 8760 hours. HbA1C: No results for input(s): HGBA1C in the last 72 hours. CBG: No results for input(s): GLUCAP in the last 168 hours. Lipid Profile: No results for input(s): CHOL, HDL, LDLCALC, TRIG, CHOLHDL, LDLDIRECT in the last 72 hours. Thyroid Function Tests: No results for input(s): TSH, T4TOTAL, FREET4, T3FREE, THYROIDAB in the last 72 hours. Anemia Panel: No results for input(s): VITAMINB12, FOLATE, FERRITIN, TIBC, IRON, RETICCTPCT in the last 72 hours. Urine analysis:    Component Value Date/Time   COLORURINE YELLOW (A) 07/22/2016 0847   APPEARANCEUR HAZY (A) 07/22/2016 0847   APPEARANCEUR Clear 10/10/2014 0919   LABSPEC 1.010 07/22/2016 0847   LABSPEC 1.017 10/10/2014 0919   PHURINE 9.0 (H) 07/22/2016 0847   GLUCOSEU  NEGATIVE 07/22/2016 0847   GLUCOSEU Negative 10/10/2014 0919   HGBUR NEGATIVE 07/22/2016 0847   BILIRUBINUR NEGATIVE 07/22/2016 0847   BILIRUBINUR Negative 10/10/2014 0919   KETONESUR NEGATIVE 07/22/2016 0847   PROTEINUR NEGATIVE 07/22/2016 0847   NITRITE POSITIVE (A) 07/22/2016 0847   LEUKOCYTESUR LARGE (A) 07/22/2016 0847   LEUKOCYTESUR Negative 10/10/2014 0919   Sepsis Labs: @LABRCNTIP (procalcitonin:4,lacticidven:4)  ) Recent Results (from the past 240 hour(s))  Blood Culture (routine x 2)     Status: None   Collection Time: 07/27/21  6:32 PM   Specimen: BLOOD  Result Value Ref Range Status   Specimen Description BLOOD  LEFT ANTECUBITAL  Final   Special Requests   Final    BOTTLES DRAWN AEROBIC AND ANAEROBIC Blood Culture results may not be optimal due to an inadequate volume of blood received in culture bottles   Culture   Final    NO GROWTH 5 DAYS Performed at [facility], [address] [place], [place] [address]    Report Status 08/01/2021 FINAL  Final  Blood Culture (routine x 2)     Status: None   Collection Time: 07/27/21  6:33 PM   Specimen: BLOOD  Result Value Ref Range Status   Specimen Description BLOOD RIGHT ANTECUBITAL  Final   Special Requests   Final    BOTTLES DRAWN AEROBIC AND ANAEROBIC Blood Culture results may not be optimal due to an excessive volume of blood received in culture bottles   Culture   Final    NO GROWTH 5 DAYS Performed at [facility], [address] [place], [place] [address]    Report Status 08/01/2021 FINAL  Final  Resp Panel by RT-PCR (Flu A&B, Covid) Nasopharyngeal Swab     Status: Abnormal   Collection Time: 07/27/21  6:33 PM   Specimen: Nasopharyngeal Swab; Nasopharyngeal(NP) swabs in vial transport medium  Result Value Ref Range Status   SARS Coronavirus 2 by RT PCR POSITIVE (A) NEGATIVE Final    Comment: (NOTE) SARS-CoV-2 target nucleic acids are DETECTED.  The SARS-CoV-2 RNA is generally detectable  in upper respiratory specimens during the acute phase of infection. Positive results are indicative of the presence of the identified virus, but do not rule out bacterial infection or co-infection with other pathogens not detected by the test. Clinical correlation with patient history and other diagnostic information is necessary to determine patient infection status. The expected result is Negative.  Fact Sheet for Patients: EntrepreneurPulse.com.au  Fact Sheet for Healthcare Providers: IncredibleEmployment.be  This test is not yet approved or cleared by the [place] FDA and  has been authorized for detection and/or diagnosis of SARS-CoV-2 by FDA under an Emergency Use Authorization (EUA).  This EUA will remain in effect (meaning this test can be used) for the duration of  the COVID-19 declaration under Section 564(b)(1) of the A ct, 21 U.S.C. section 360bbb-3(b)(1), unless the authorization is terminated or revoked sooner.     Influenza A by PCR NEGATIVE NEGATIVE Final   Influenza B by PCR NEGATIVE NEGATIVE Final    Comment: (NOTE) The Xpert Xpress SARS-CoV-2/FLU/RSV plus assay is intended as an aid in the diagnosis of influenza from Nasopharyngeal swab specimens and should not be used as a sole basis for treatment. Nasal washings and aspirates are unacceptable for Xpert Xpress SARS-CoV-2/FLU/RSV testing.  Fact Sheet for Patients: EntrepreneurPulse.com.au  Fact Sheet for Healthcare Providers: IncredibleEmployment.be  This test is not yet approved or cleared by the [place] FDA and has been authorized for detection and/or diagnosis of SARS-CoV-2 by FDA under an Emergency Use Authorization (EUA). This EUA will remain in effect (meaning this test can be used) for the duration of the COVID-19 declaration under Section 564(b)(1) of the Act, 21 U.S.C. section 360bbb-3(b)(1), unless the authorization  is terminated or revoked.  Performed at [facility], [address] [place], [place] [address]   MRSA Next Gen by PCR, Nasal     Status: None   Collection Time: 07/28/21  1:08 PM   Specimen: Nasal Mucosa; Nasal Swab  Result Value Ref Range Status   MRSA by PCR Next Gen NOT DETECTED  NOT DETECTED Final    Comment: (NOTE) The GeneXpert MRSA Assay (FDA approved for NASAL specimens only), is one component of a comprehensive MRSA colonization surveillance program. It is not intended to diagnose MRSA infection nor to guide or monitor treatment for MRSA infections. Test performance is not FDA approved in patients less than 101 years old. Performed at [facility], [address] [place], [place] [address]   Blood culture (routine x 2)     Status: None (Preliminary result)   Collection Time: 08/02/21  8:32 PM   Specimen: BLOOD  Result Value Ref Range Status   Specimen Description BLOOD RIGHT ANTECUBITAL  Final   Special Requests   Final    BOTTLES DRAWN AEROBIC AND ANAEROBIC Blood Culture adequate volume   Culture   Final    NO GROWTH < 12 HOURS Performed at [facility], [address] [place], [place] [address]    Report Status PENDING  Incomplete  Blood culture (routine x 2)     Status: None (Preliminary result)   Collection Time: 08/02/21  8:32 PM   Specimen: BLOOD  Result Value Ref Range Status   Specimen Description BLOOD LEFT ANTECUBITAL  Final   Special Requests   Final    BOTTLES DRAWN AEROBIC AND ANAEROBIC Blood Culture adequate volume   Culture   Final    NO GROWTH < 12 HOURS Performed at [facility], [address], [place], [place] [address]    Report Status PENDING  Incomplete  MRSA Next Gen by PCR, Nasal     Status: None   Collection Time: 08/02/21 10:49 PM   Specimen: Nasal Mucosa; Nasal Swab  Result Value Ref Range Status   MRSA by PCR Next Gen NOT DETECTED NOT DETECTED Final    Comment: (NOTE) The GeneXpert MRSA  Assay (FDA approved for NASAL specimens only), is one component of a comprehensive MRSA colonization surveillance program. It is not intended to diagnose MRSA infection nor to guide or monitor treatment for MRSA infections. Test performance is not FDA approved in patients less than 68 years old. Performed at [facility], [address], [place], [place] [address]       Studies: DG Chest 2 View  Result Date: 08/02/2021 CLINICAL DATA:  Provided history: Low oxygen saturation. Additional history provided: Patient diagnosed with pneumonia, discharge from the hospital on Monday, tested positive for COVID 07/27/2021. EXAM: CHEST - 2 VIEW COMPARISON:  Prior chest radiographs 07/27/2021 and earlier. FINDINGS: Mild cardiomegaly, unchanged. Aortic atherosclerosis. Although improved from the prior examination of 07/27/2021, there is persistent airspace disease within the right lung base. Additionally, there is suggestion of subtle patchy airspace disease within the right upper lobe, a finding not appreciated on the prior chest radiographs. Redemonstrated chronic elevation of the left hemidiaphragm. Subtle blunting of the right lateral costophrenic angle, which may reflect a trace right pleural effusion. No evidence of pneumothorax. No acute bony abnormality identified. Degenerative changes of the spine. IMPRESSION: Right lower lobe airspace disease, persistent although improved as compared to the prior chest radiographs of 07/27/2021. Radiographic follow-up to complete resolution recommended. Suggestion of subtle patchy airspace disease within the right upper lobe, a new finding. Cardiomegaly, unchanged. Possible trace right pleural effusion. Aortic Atherosclerosis (ICD10-I70.0). Electronically Signed   By: Kellie Simmering D.O.   On: 08/02/2021 18:46   CT Angio Chest Pulmonary Embolism (PE) W or WO Contrast  Result Date: 08/02/2021 CLINICAL DATA:  Chest pain and shortness of breath with respiratory  failure and hypoxia, history of COVID-19 positivity, initial encounter EXAM: CT ANGIOGRAPHY CHEST WITH CONTRAST TECHNIQUE: Multidetector CT imaging of the chest was performed using the standard protocol during bolus administration of intravenous contrast. Multiplanar CT image reconstructions and MIPs were obtained to evaluate the vascular anatomy. RADIATION DOSE REDUCTION: This exam was performed according to the departmental dose-optimization program which includes automated exposure control, adjustment of the mA and/or kV according to patient size and/or use of iterative reconstruction technique. CONTRAST:  22mL OMNIPAQUE IOHEXOL 350 MG/ML SOLN COMPARISON:  Chest x-ray from earlier in the same day. FINDINGS: Cardiovascular: Atherosclerotic calcifications of the thoracic aorta are noted. No aneurysmal dilatation or dissection is noted. Mild cardiac enlargement is noted. Coronary calcifications are seen. The pulmonary artery shows a normal branching pattern bilaterally. Filling defects are noted within the left lower lobe pulmonary arterial branches consistent with pulmonary emboli. Smaller emboli are noted involving the right middle lobe as well. No findings of right heart strain are noted. Mediastinum/Nodes: Thoracic inlet is within normal limits. No sizable hilar or mediastinal adenopathy is noted. The esophagus as visualized is within normal limits. Lungs/Pleura: Lungs are well aerated bilaterally. Mild atelectatic changes are noted in the left lower lobe. Consolidation in the right lower lobe is noted with associated pleural effusion. Patchy infiltrates are noted within the right upper lobe as well. Upper Abdomen: Visualized upper abdomen is within normal limits. Musculoskeletal: No acute rib abnormality is noted. Degenerative changes of the thoracic spine are seen. Review of the MIP images confirms the above findings. IMPRESSION: Bilateral pulmonary emboli without definitive right heart strain. Right lower  lobe consolidation with small effusion. Patchy infiltrates are noted within the right upper lobe which may be related to the recent history of COVID. Aortic Atherosclerosis (ICD10-I70.0). Critical Value/emergent results were called by telephone at the time of interpretation on 08/02/2021 at 9:12 pm to Dr. Kerman Passey , who verbally acknowledged these results. Electronically Signed   By: Inez Catalina M.D.   On: 08/02/2021 21:15    Scheduled Meds:  amLODipine  5 mg Oral Daily   gabapentin  100 mg Oral BID WC   gabapentin  200 mg Oral QHS   ipratropium  2 spray Each Nare QID   ipratropium-albuterol  3 mL Nebulization BID   QUEtiapine  12.5 mg Oral BID   sodium chloride flush  3 mL Intravenous Q12H    Continuous Infusions:  sodium chloride     sodium chloride 50 mL/hr at 08/03/21 0304   ceFEPime (MAXIPIME) IV     heparin 1,000 Units/hr (08/03/21 0304)   [START ON 08/04/2021] vancomycin       LOS: 1 day     Kayleen Memos, MD [facility] Pager [phone]  If 7PM-7AM, please contact night-coverage [email or web address] Password [email or web address] 08/03/2021, 2:26 PM

## 2021-08-03 NOTE — TOC Initial Note (Signed)
Transition of Care [facility]) - Initial/Assessment Note    Patient Details  Name: Hannah Dillon MRN: [id number] Date of Birth: [birth year]  Transition of Care [facility]) CM/SW Contact:    Marlowe Sax, RN Phone Number: 08/03/2021, 10:08 AM  Clinical Narrative:                 Patient lives with  Margarita Mail who provides transportation. PCP is [facility]. Pharmacy is [facility] [place]. Patient has a RW. And 3 in 1, Patient discharged from the hospital on Monday 1/16 and was set up with [facility] at that time,         Patient Goals and CMS Choice        Expected Discharge Plan and Services                                                Prior Living Arrangements/Services                       Activities of Daily Living Home Assistive Devices/Equipment: Dan Humphreys (specify type) ADL Screening (condition at time of admission) Patient's cognitive ability adequate to safely complete daily activities?: Yes Is the patient deaf or have difficulty hearing?: No Does the patient have difficulty seeing, even when wearing glasses/contacts?: No Does the patient have difficulty concentrating, remembering, or making decisions?: Yes Patient able to express need for assistance with ADLs?: No Does the patient have difficulty dressing or bathing?: Yes Independently performs ADLs?: No Communication: Independent Dressing (OT): Dependent Is this a change from baseline?: Pre-admission baseline Grooming: Dependent Is this a change from baseline?: Pre-admission baseline Feeding: Dependent Is this a change from baseline?: Pre-admission baseline Bathing: Dependent Is this a change from baseline?: Pre-admission baseline Toileting: Dependent Is this a change from baseline?: Pre-admission baseline In/Out Bed: Dependent Is this a change from baseline?: Pre-admission baseline Walks in Home: Dependent Is this a change from baseline?: Pre-admission baseline Does the patient  have difficulty walking or climbing stairs?: Yes Weakness of Legs: Both Weakness of Arms/Hands: Both  Permission Sought/Granted                  Emotional Assessment              Admission diagnosis:  SOB (shortness of breath) [R06.02] Acute respiratory failure with hypoxia (HCC) [J96.01] Multifocal pneumonia [J18.9] Multiple subsegmental pulmonary emboli without acute cor pulmonale (HCC) [I26.94] Patient Active Problem List   Diagnosis Date Noted   Pulmonary embolism (HCC) 08/02/2021   SOB (shortness of breath) 08/02/2021   Sepsis (HCC) 07/30/2021   Generalized weakness 07/30/2021   Chronic pain 07/30/2021   Hypertension 07/30/2021   Dementia (HCC) 07/30/2021   Acute on chronic respiratory failure with hypoxia (HCC) 07/27/2021   CAP (community acquired pneumonia) 07/27/2021   COVID-19 virus infection 07/27/2021   Degenerative joint disease    Cervical radiculopathy    Vascular dementia without behavioral disturbance (HCC) 03/11/2019   PCP:  System, Provider Not In Pharmacy:   [facility] [address], [place] - [address] [place] [address] Phone: [phone] Fax: [phone]  [id number] [place] - [address] AT [facility] [address] [place] [address] Phone: [phone] Fax: [phone]     Social  Determinants of Health (SDOH) Interventions    Readmission Risk Interventions No flowsheet data found.

## 2021-08-03 NOTE — Progress Notes (Signed)
ANTICOAGULATION CONSULT NOTE - Initial Consult  Pharmacy Consult for Heparin drip Indication: pulmonary embolus  No Known Allergies  Patient Measurements: Height: 5\' 4"  (162.6 cm) Weight: 56.8 kg (125 lb 3.5 oz) IBW/kg (Calculated) : 54.7 Heparin Dosing Weight: 56.7 kg  Vital Signs: Temp: 98.8 F (37.1 C) (01/19 1104) BP: 159/72 (01/19 1104) Pulse Rate: 79 (01/19 1104)  Labs: Recent Labs    08/02/21 1844 08/02/21 2032 08/02/21 2146 08/03/21 0153 08/03/21 0622 08/03/21 1434  HGB 12.3  --   --  11.5* 11.8*  --   HCT 38.0  --   --  34.8* 35.3*  --   PLT 412*  --   --  444* 457*  --   APTT  --   --  25  --   --   --   LABPROT  --   --  14.0  --   --   --   INR  --   --  1.1  --   --   --   HEPARINUNFRC  --   --   --   --  0.51 0.12*  CREATININE 0.95  --   --  0.75 0.67  --   TROPONINIHS 13 14  --   --   --   --      Estimated Creatinine Clearance: 35.5 mL/min (by C-G formula based on SCr of 0.67 mg/dL).   Medical History: Past Medical History:  Diagnosis Date   Arthritis    Cervical radiculopathy    DJD (degenerative joint disease)    Left arm pain     Medications:  Scheduled:   amLODipine  5 mg Oral Daily   gabapentin  100 mg Oral BID WC   gabapentin  200 mg Oral QHS   ipratropium  2 spray Each Nare QID   ipratropium-albuterol  3 mL Nebulization BID   QUEtiapine  12.5 mg Oral BID   sodium chloride flush  3 mL Intravenous Q12H   Infusions:   sodium chloride     sodium chloride 50 mL/hr at 08/03/21 0304   ceFEPime (MAXIPIME) IV     heparin 1,000 Units/hr (08/03/21 0304)   [START ON 08/04/2021] vancomycin      Assessment: 88 yo F to start Heparin drip for +PE No anticoagulation PTA per Med Rec Hgb 12.3   plt 412  INR 1.1  aPTT 25  Goal of Therapy:  Heparin level 0.3-0.7 units/ml Monitor platelets by anticoagulation protocol: Yes  1/19@ 0622  HL 0.51- therapeutic x1 at 1000 units/hr 1/19@1434    HL 0.12- subtherapeutic   Plan:  HL  subtherapeutic at 0.12 (verified by nurse, heparin drip NOT held during the day). Bolus with heparin 1500 units x1 now, then increase infusion rate to 1100 units/hr Repeat HL 8 hrs after rate change  Kaydon Husby Rodriguez-Guzman PharmD, BCPS 08/03/2021 3:24 PM

## 2021-08-03 NOTE — Progress Notes (Signed)
PT Cancellation Note  Patient Details Name: Hannah Dillon MRN: [id number] DOB: 04/10/1923   Cancelled Treatment:    Reason Eval/Treat Not Completed: Patient not medically ready. New diagnosis of PE with Heparin drip initiated last night. Per best practice, PT will hold on mobility efforts today and follow up when appropriate.   Donna Bernard, PT, MPT  Ina Homes 08/03/2021, 8:43 AM

## 2021-08-04 ENCOUNTER — Inpatient Hospital Stay
Admit: 2021-08-04 | Discharge: 2021-08-04 | Disposition: A | Discharge status: Home / Self Care | Payer: Medicare Other | Attending: Internal Medicine | Admitting: Internal Medicine

## 2021-08-04 ENCOUNTER — Inpatient Hospital Stay: Admit: 2021-08-04 | Payer: Medicare Other

## 2021-08-04 ENCOUNTER — Encounter: Payer: Self-pay | Admitting: Internal Medicine

## 2021-08-04 DIAGNOSIS — F0390 Unspecified dementia without behavioral disturbance: Secondary | ICD-10-CM

## 2021-08-04 DIAGNOSIS — J9621 Acute and chronic respiratory failure with hypoxia: Secondary | ICD-10-CM

## 2021-08-04 DIAGNOSIS — R918 Other nonspecific abnormal finding of lung field: Secondary | ICD-10-CM

## 2021-08-04 DIAGNOSIS — I2699 Other pulmonary embolism without acute cor pulmonale: Principal | ICD-10-CM

## 2021-08-04 DIAGNOSIS — I10 Essential (primary) hypertension: Secondary | ICD-10-CM

## 2021-08-04 LAB — CBC WITH DIFFERENTIAL/PLATELET
Abs Immature Granulocytes: 0.39 10*3/uL — ABNORMAL HIGH (ref 0.00–0.07)
Basophils Absolute: 0.1 10*3/uL (ref 0.0–0.1)
Basophils Relative: 0 %
Eosinophils Absolute: 0 10*3/uL (ref 0.0–0.5)
Eosinophils Relative: 0 %
HCT: 32.9 % — ABNORMAL LOW (ref 36.0–46.0)
Hemoglobin: 11.1 g/dL — ABNORMAL LOW (ref 12.0–15.0)
Immature Granulocytes: 2 %
Lymphocytes Relative: 8 %
Lymphs Abs: 1.6 10*3/uL (ref 0.7–4.0)
MCH: 31.4 pg (ref 26.0–34.0)
MCHC: 33.7 g/dL (ref 30.0–36.0)
MCV: 92.9 fL (ref 80.0–100.0)
Monocytes Absolute: 1.6 10*3/uL — ABNORMAL HIGH (ref 0.1–1.0)
Monocytes Relative: 8 %
Neutro Abs: 16.2 10*3/uL — ABNORMAL HIGH (ref 1.7–7.7)
Neutrophils Relative %: 82 %
Platelets: 477 10*3/uL — ABNORMAL HIGH (ref 150–400)
RBC: 3.54 MIL/uL — ABNORMAL LOW (ref 3.87–5.11)
RDW: 11.6 % (ref 11.5–15.5)
WBC: 19.9 10*3/uL — ABNORMAL HIGH (ref 4.0–10.5)
nRBC: 0 % (ref 0.0–0.2)

## 2021-08-04 LAB — COMPREHENSIVE METABOLIC PANEL
ALT: 12 U/L (ref 0–44)
AST: 17 U/L (ref 15–41)
Albumin: 3.5 g/dL (ref 3.5–5.0)
Alkaline Phosphatase: 63 U/L (ref 38–126)
Anion gap: 10 (ref 5–15)
BUN: 17 mg/dL (ref 8–23)
CO2: 22 mmol/L (ref 22–32)
Calcium: 8.5 mg/dL — ABNORMAL LOW (ref 8.9–10.3)
Chloride: 103 mmol/L (ref 98–111)
Creatinine, Ser: 0.58 mg/dL (ref 0.44–1.00)
GFR, Estimated: >60 mL/min (ref >60)
Glucose, Bld: 120 mg/dL — ABNORMAL HIGH (ref 70–99)
Potassium: 3.6 mmol/L (ref 3.5–5.1)
Sodium: 135 mmol/L (ref 135–145)
Total Bilirubin: 0.7 mg/dL (ref 0.3–1.2)
Total Protein: 7 g/dL (ref 6.5–8.1)

## 2021-08-04 LAB — ECHOCARDIOGRAM COMPLETE
AR max vel: 1.41 cm2
AV Area VTI: 1.64 cm2
AV Area mean vel: 1.57 cm2
AV Mean grad: 12 mmHg
AV Peak grad: 22.8 mmHg
Ao pk vel: 2.39 m/s
Area-P 1/2: 3.27 cm2
Height: 64 in
MV VTI: 2.14 cm2
S' Lateral: 2.17 cm
Weight: 2003.54 oz

## 2021-08-04 LAB — PROCALCITONIN: Procalcitonin: 0.24 ng/mL

## 2021-08-04 LAB — MAGNESIUM: Magnesium: 2.1 mg/dL (ref 1.7–2.4)

## 2021-08-04 LAB — PHOSPHORUS: Phosphorus: 2.7 mg/dL (ref 2.5–4.6)

## 2021-08-04 MED ORDER — APIXABAN 5 MG PO TABS: 5.0000 mg | ORAL_TABLET | Freq: Two times a day (BID) | ORAL | Status: DC

## 2021-08-04 MED ORDER — APIXABAN 5 MG PO TABS
10.0000 mg | ORAL_TABLET | Freq: Two times a day (BID) | ORAL | Status: DC
  Administered 2021-08-04 – 2021-08-05 (×3): 10 mg via ORAL
  Filled 2021-08-04 (×3): qty 2

## 2021-08-04 NOTE — Evaluation (Signed)
Physical Therapy Evaluation Patient Details Name: Hannah Dillon MRN: [id number] DOB: January 07, 1923 Today's Date: 08/04/2021  History of Present Illness  Pt is a 88 y/o F admitted on 08/02/21 after presenting to the ED with c/o sudden onset SOB & associated hypoxia on room air. Pt was recently admitted 07/18/21-07/31/21 for tx of Covid. Work up revealed PE & pt was started on heparin drip.   PMH: dementia, cervical radiculopathy, DJD -- MD cleared pt for participation prior to completing 48 hrs of anticoagulation (via secure chat).  Clinical Impression  Pt seen for PT evaluation with pt asleep but easily awakened & agreeable to tx. Pt is oriented to self & able to select "hospital" from choice of 3. Pt initially reports she ambulated with RW at home but when provided with AD but attempts to push it off to the side during gait. Pt ambulates to door & back of room with 1UE HHA & min assist. Pt does note fatigue after gait & pt assisted back to bed. Will follow pt acutely to progress endurance, balance & gait.        Recommendations for follow up therapy are one component of a multi-disciplinary discharge planning process, led by the attending physician.  Recommendations may be updated based on patient status, additional functional criteria and insurance authorization.  Follow Up Recommendations Home health PT    Assistance Recommended at Discharge Frequent or constant Supervision/Assistance  Patient can return home with the following  Assistance with cooking/housework;A little help with bathing/dressing/bathroom;A little help with walking and/or transfers;Direct supervision/assist for financial management;Assist for transportation;Assistance with feeding;Help with stairs or ramp for entrance;Direct supervision/assist for medications management    Equipment Recommendations None recommended by PT  Recommendations for Other Services       Functional Status Assessment Patient has had a recent decline in  their functional status and demonstrates the ability to make significant improvements in function in a reasonable and predictable amount of time.     Precautions / Restrictions Precautions Precautions: Fall Restrictions Weight Bearing Restrictions: No      Mobility  Bed Mobility Overal bed mobility: Needs Assistance Bed Mobility: Supine to Sit     Supine to sit: Min assist, HOB elevated (tactile cuing/manual facilitation to initiate supine>sit, use of bed rails)          Transfers Overall transfer level: Needs assistance Equipment used: 1 person hand held assist Transfers: Sit to/from Stand Sit to Stand: Min assist                Ambulation/Gait Ambulation/Gait assistance: Min assist Gait Distance (Feet): 65 Feet Assistive device: 1 person hand held assist Gait Pattern/deviations: Decreased step length - left, Decreased stride length, Decreased step length - right Gait velocity: decreased        Stairs            Wheelchair Mobility    Modified Rankin (Stroke Patients Only)       Balance Overall balance assessment: Needs assistance Sitting-balance support: Feet supported, Bilateral upper extremity supported Sitting balance-Leahy Scale: Poor     Standing balance support: Single extremity supported, During functional activity Standing balance-Leahy Scale: Poor                               Pertinent Vitals/Pain Pain Assessment Pain Assessment: Faces Faces Pain Scale: No hurt    Home Living Family/patient expects to be discharged to:: Private residence Living Arrangements: Children  Type of Home: House Home Access: Stairs to enter   [employer] of Steps: 2-3   Home Layout: One level Home Equipment: Conservation officer, nature (2 wheels)      Prior Function Prior Level of Function : Independent/Modified Independent (all information taken from chart)             Mobility Comments: Pt has RW but does not use at  baseline for functional mobility. Son reports she has had 3 falls in 6 months. ADLs Comments: Pt performs sink bathing but has been coming more paranoid and not doing "a great job" per son. Son assists with medication , meals, and taking pt to appointments.     Hand Dominance        Extremity/Trunk Assessment   Upper Extremity Assessment Upper Extremity Assessment: Generalized weakness    Lower Extremity Assessment Lower Extremity Assessment: Generalized weakness    Cervical / Trunk Assessment Cervical / Trunk Assessment: Kyphotic  Communication      Cognition Arousal/Alertness: Awake/alert Behavior During Therapy: Flat affect Overall Cognitive Status: History of cognitive impairments - at baseline Area of Impairment: Orientation, Attention, Following commands, Memory, Awareness, Problem solving, Safety/judgement                 Orientation Level: Disoriented to, Place, Time, Situation   Memory: Decreased short-term memory Following Commands: Follows one step commands inconsistently Safety/Judgement: Decreased awareness of safety, Decreased awareness of deficits Awareness: Intellectual Problem Solving: Decreased initiation, Requires verbal cues General Comments: Requires tactile cuing/manual facilitation to being supine>sit.        General Comments General comments (skin integrity, edema, etc.): Pt on room air, SpO2 initially 88% but increased >/= 90% & stayed >90% throughout remainder of session, Max HR 122 bpm.    Exercises     Assessment/Plan    PT Assessment Patient needs continued PT services  PT Problem List Decreased strength;Decreased mobility;Decreased safety awareness;Decreased coordination;Decreased balance;Decreased cognition;Decreased activity tolerance;Cardiopulmonary status limiting activity;Decreased knowledge of use of DME       PT Treatment Interventions Therapeutic exercise;DME instruction;Gait training;Balance training;Stair  training;Neuromuscular re-education;Functional mobility training;Cognitive remediation;Therapeutic activities;Patient/family education    PT Goals (Current goals can be found in the Care Plan section)  Acute Rehab PT Goals Time For Goal Achievement: 08/18/21 Potential to Achieve Goals: Fair    Frequency Min 2X/week     Co-evaluation               AM-PAC PT "6 Clicks" Mobility  Outcome Measure Help needed turning from your back to your side while in a flat bed without using bedrails?: A Little Help needed moving from lying on your back to sitting on the side of a flat bed without using bedrails?: A Lot Help needed moving to and from a bed to a chair (including a wheelchair)?: A Little Help needed standing up from a chair using your arms (e.g., wheelchair or bedside chair)?: A Little Help needed to walk in hospital room?: A Little Help needed climbing 3-5 steps with a railing? : Total 6 Click Score: 15    End of Session Equipment Utilized During Treatment: Gait belt Activity Tolerance: Patient limited by fatigue (pt c/o feeling "worn out") Patient left: in bed;with call bell/phone within reach;with bed alarm set Nurse Communication: Mobility status PT Visit Diagnosis: Unsteadiness on feet (R26.81);Muscle weakness (generalized) (M62.81);Difficulty in walking, not elsewhere classified (R26.2)    Time: [id number] PT Time Calculation (min) (ACUTE ONLY): 8 min   Charges:   PT Evaluation $PT Eval Moderate  Complexity: [place], DPT 08/04/21, 12:41 PM   Waunita Schooner 08/04/2021, 12:40 PM

## 2021-08-04 NOTE — Assessment & Plan Note (Addendum)
This is a resolving pneumonia due to [place].  Outside infectious window.  No further treatment needed.  Respiratory failure ruled out.  Bacterial pneumonia ruled out.  Antibiotics stopped, no further fever or hypoxia.

## 2021-08-04 NOTE — Hospital Course (Signed)
Hannah Dillon is a 88 y.o. female seen in ed with complaints of sudden onset shortness of breath noted when she was doing physical therapy.  Associated with hypoxia with O2 saturation of 85% on room air.  Pt was diagnosed with recent Covid -19 on 07/18/2021 and d/c on 07/31/2021.  Work-up revealed pulmonary embolism seen on CT scan.  She was started on heparin drip.

## 2021-08-04 NOTE — Progress Notes (Signed)
*  PRELIMINARY RESULTS* Echocardiogram 2D Echocardiogram has been performed.  [name] 08/04/2021, 12:39 PM

## 2021-08-04 NOTE — Assessment & Plan Note (Signed)
-   Stop heparin - Start Eliquis - Follow-up echocardiogram

## 2021-08-04 NOTE — Care Management Important Message (Signed)
Important Message  Patient Details  Name: Isaly Fasching MRN: [id number] Date of Birth: 12-Feb-1923   Medicare Important Message Given:  Yes     Olegario Messier A Viera Okonski 08/04/2021, 3:02 PM

## 2021-08-04 NOTE — Plan of Care (Signed)
Patient awake most of the night. Oriented to self only. Pt attempting to get OOB without assistance and removing medical equipment. Randol Kern NP made aware, haldol ordered. Mittens applied. Heparin gtt ongoing. Oxygen on at 2L. Bed in lowest position. Bed alarm on.  PLAN OF CARE ONGOING Problem: Education: Goal: Knowledge of General Education information will improve Description: Including pain rating scale, medication(s)/side effects and non-pharmacologic comfort measures Outcome: Progressing   Problem: Health Behavior/Discharge Planning: Goal: Ability to manage health-related needs will improve Outcome: Progressing   Problem: Clinical Measurements: Goal: Ability to maintain clinical measurements within normal limits will improve Outcome: Progressing Goal: Will remain free from infection Outcome: Progressing Goal: Diagnostic test results will improve Outcome: Progressing Goal: Respiratory complications will improve Outcome: Progressing Goal: Cardiovascular complication will be avoided Outcome: Progressing   Problem: Activity: Goal: Risk for activity intolerance will decrease Outcome: Progressing   Problem: Nutrition: Goal: Adequate nutrition will be maintained Outcome: Progressing   Problem: Coping: Goal: Level of anxiety will decrease Outcome: Progressing   Problem: Elimination: Goal: Will not experience complications related to bowel motility Outcome: Progressing Goal: Will not experience complications related to urinary retention Outcome: Progressing   Problem: Pain Managment: Goal: General experience of comfort will improve Outcome: Progressing   Problem: Skin Integrity: Goal: Risk for impaired skin integrity will decrease Outcome: Progressing

## 2021-08-04 NOTE — Assessment & Plan Note (Signed)
-   Continue amlodipine ?

## 2021-08-04 NOTE — Progress Notes (Addendum)
°  Progress Note   Patient: Hannah Dillon [id number] DOB: 01-01-1923 DOA: 08/02/2021     2 DOS: the patient was seen and examined on 08/04/2021   Brief hospital course: Hannah Dillon is a 88 y.o. female seen in ed with complaints of sudden onset shortness of breath noted when she was doing physical therapy.  Associated with hypoxia with O2 saturation of 85% on room air.  Pt was diagnosed with recent Covid -19 on 07/18/2021 and d/c on 07/31/2021.  Work-up revealed pulmonary embolism seen on CT scan.  She was started on heparin drip.   Assessment and Plan * Pulmonary embolism ([place])- (present on admission) - Stop heparin - Start Eliquis - Follow-up echocardiogram  Lung infiltrate This is a resolving pneumonia due to COVID.  Outside infectious window.  No further treatment needed.  Respiratory failure ruled out.  Bacterial pneumonia ruled out.  Antibiotics stopped, no further fever or hypoxia.  Hypertension- (present on admission)  - Continue amlodipine  Vascular dementia without behavioral disturbance ([place])- (present on admission) - Hold Xtampza ER - Continue gabapentin     Subjective: Patient has no complaints.  She feels at baseline.  She has a mild cough.  No fever.  No confusion more than baseline.  Objective Vital signs were reviewed and unremarkable. Elderly adult female, sitting in bed, eating breakfast, interactive with her son and me.  No acute distress.  Lung sounds with some coarse breath sounds but no wheezing, no crackles.  Heart rate regular, no murmurs.  No lower extremity edema.  Attention slightly distracted, affect blunted, judgment insight appear impaired by dementia.  Data Reviewed:   My review of labs and imaging is notable for complete metabolic panel with normal renal function, electrolytes, and transaminases.  Magnesium normal.  CT angiogram of the chest report reviewed, shows bilateral pulmonary embolism and lobar arteries, no right heart strain.   Echocardiogram report reviewed, normal EF. Eliquis started     Family Communication: Son at the bedside  Disposition: Status is: Inpatient  Remains inpatient appropriate because: She requires transition to oral anticoagulant, echocardiogram.  If she does well with Eliquis today, likely discharge          Author: Edwin Dada, MD 08/04/2021 8:16 PM  For on call review [email or web address].

## 2021-08-04 NOTE — Progress Notes (Signed)
ANTICOAGULATION CONSULT NOTE - Initial Consult  Pharmacy Consult for Heparin drip Indication: pulmonary embolus  No Known Allergies  Patient Measurements: Height: 5\' 4"  (162.6 cm) Weight: 56.8 kg (125 lb 3.5 oz) IBW/kg (Calculated) : 54.7 Heparin Dosing Weight: 56.7 kg  Vital Signs: Temp: 98 F (36.7 C) (01/19 1949) BP: 183/74 (01/19 1949) Pulse Rate: 85 (01/19 1949)  Labs: Recent Labs    08/02/21 1844 08/02/21 2032 08/02/21 2146 08/03/21 0153 08/03/21 0622 08/03/21 1434 08/03/21 2306  HGB 12.3  --   --  11.5* 11.8*  --   --   HCT 38.0  --   --  34.8* 35.3*  --   --   PLT 412*  --   --  444* 457*  --   --   APTT  --   --  25  --   --   --   --   LABPROT  --   --  14.0  --   --   --   --   INR  --   --  1.1  --   --   --   --   HEPARINUNFRC  --   --   --   --  0.51 0.12* 0.76*  CREATININE 0.95  --   --  0.75 0.67  --   --   TROPONINIHS 13 14  --   --   --   --   --      Estimated Creatinine Clearance: 35.5 mL/min (by C-G formula based on SCr of 0.67 mg/dL).   Medical History: Past Medical History:  Diagnosis Date   Arthritis    Cervical radiculopathy    DJD (degenerative joint disease)    Left arm pain     Medications:  Scheduled:   amLODipine  5 mg Oral Daily   gabapentin  100 mg Oral BID WC   gabapentin  200 mg Oral QHS   ipratropium  2 spray Each Nare QID   ipratropium-albuterol  3 mL Nebulization BID   sodium chloride flush  3 mL Intravenous Q12H   Infusions:   sodium chloride     ceFEPime (MAXIPIME) IV 2 g (08/03/21 2158)   heparin 1,100 Units/hr (08/03/21 1607)   vancomycin      Assessment: 88 yo F to start Heparin drip for +PE No anticoagulation PTA per Med Rec Hgb 12.3   plt 412  INR 1.1  aPTT 25  Goal of Therapy:  Heparin level 0.3-0.7 units/ml Monitor platelets by anticoagulation protocol: Yes  1/19@ 0622  HL 0.51- therapeutic x1 at 1000 units/hr 1/19@1434    HL 0.12- subtherapeutic 1/19@2306    HL 0.76   Plan:  1/20:  HL @  2306 = 0.76, SUPRAtherapeutic Will decrease heparin rate to 1000 units/hr and recheck HL 8 hrs after rate change.  Hannah Dillon D 08/04/2021 12:29 AM

## 2021-08-04 NOTE — Assessment & Plan Note (Signed)
-   Hold Xtampza ER - Continue gabapentin

## 2021-08-05 LAB — CBC
HCT: 34.2 % — ABNORMAL LOW (ref 36.0–46.0)
Hemoglobin: 11.3 g/dL — ABNORMAL LOW (ref 12.0–15.0)
MCH: 31.5 pg (ref 26.0–34.0)
MCHC: 33 g/dL (ref 30.0–36.0)
MCV: 95.3 fL (ref 80.0–100.0)
Platelets: 486 10*3/uL — ABNORMAL HIGH (ref 150–400)
RBC: 3.59 MIL/uL — ABNORMAL LOW (ref 3.87–5.11)
RDW: 11.6 % (ref 11.5–15.5)
WBC: 14.1 10*3/uL — ABNORMAL HIGH (ref 4.0–10.5)
nRBC: 0 % (ref 0.0–0.2)

## 2021-08-05 MED ORDER — APIXABAN (ELIQUIS) VTE STARTER PACK (10MG AND 5MG): ORAL_TABLET | ORAL | 0 refills | Status: DC

## 2021-08-05 NOTE — Evaluation (Signed)
Occupational Therapy Evaluation Patient Details Name: Hannah Dillon MRN: [id number] DOB: 11-26-1922 Today's Date: 08/05/2021   History of Present Illness Pt is a 88 y/o F admitted on 08/02/21 after presenting to the ED with c/o sudden onset SOB & associated hypoxia on room air. Pt was recently admitted 07/18/21-07/31/21 for tx of Covid. Work up revealed PE & pt was started on heparin drip.   PMH: dementia, cervical radiculopathy, DJD -- MD cleared pt for participation prior to completing 48 hrs of anticoagulation (via secure chat).   Clinical Impression   Pt seen for OT evaluation on 08/04/21 in acute setting d/t SOB. Found to have small PE and okayed for therapy participation by MD. Pt is only oriented to self and generally confused throughout, but able to follow basic commands. Pt is poor historian so unsure of baseline, information below taken from chart. Currently she requires: SETUP for UB g/h tasks with cues to sequence, MIN A for UB bathing/dressing. MOD A for LB bathing in standing with RW for support for posterior, MOD A to don socks in sitting. MIN A wiht RW for ADL Transfers, cues throughout for safety awareness, noted some impulsivity. OT transfers pt to recliner with alarm set and all needs met/in reach end of session. Will continue to follow. Recommend HHOT f/u as well as 24/7 SUPV support from family.      Recommendations for follow up therapy are one component of a multi-disciplinary discharge planning process, led by the attending physician.  Recommendations may be updated based on patient status, additional functional criteria and insurance authorization.   Follow Up Recommendations  Home health OT    Assistance Recommended at Discharge Frequent or constant Supervision/Assistance  Patient can return home with the following A little help with walking and/or transfers;A little help with bathing/dressing/bathroom;Assistance with cooking/housework;Direct supervision/assist for medications  management;Direct supervision/assist for financial management;Help with stairs or ramp for entrance    Functional Status Assessment  Patient has had a recent decline in their functional status and demonstrates the ability to make significant improvements in function in a reasonable and predictable amount of time.  Equipment Recommendations  BSC/3in1    Recommendations for Other Services       Precautions / Restrictions Precautions Precautions: Fall      Mobility Bed Mobility Overal bed mobility: Needs Assistance Bed Mobility: Supine to Sit     Supine to sit: Min assist, HOB elevated     General bed mobility comments: tactile cues to sequence    Transfers Overall transfer level: Needs assistance Equipment used: 1 person hand held assist, Rolling walker (2 wheels) Transfers: Sit to/from Stand Sit to Stand: Min assist           General transfer comment: increased time, cues for safety/sequencing including tactile cues d/t confusion      Balance Overall balance assessment: Needs assistance Sitting-balance support: Feet supported, Bilateral upper extremity supported Sitting balance-Leahy Scale: Fair       Standing balance-Leahy Scale: Poor                             ADL either performed or assessed with clinical judgement   ADL Overall ADL's : Needs assistance/impaired                                       General ADL Comments: SETUP for UB g/h  tasks with cues to sequence, MIN A for UB bathing/dressing. MOD A for LB bathing in standing with RW for support for posterior, MOD A to don socks in sitting. MIN A wiht RW for ADL Transfers, cues throughout for safety awareness, noted some impulsivity.     Vision Patient Visual Report: No change from baseline       Perception     Praxis      Pertinent Vitals/Pain Pain Assessment Pain Assessment: Faces Faces Pain Scale: No hurt     Hand Dominance Right   Extremity/Trunk  Assessment Upper Extremity Assessment Upper Extremity Assessment: Generalized weakness   Lower Extremity Assessment Lower Extremity Assessment: Generalized weakness       Communication Communication Communication: No difficulties   Cognition Arousal/Alertness: Awake/alert Behavior During Therapy: Flat affect Overall Cognitive Status: History of cognitive impairments - at baseline Area of Impairment: Orientation, Attention, Following commands, Memory, Awareness, Problem solving, Safety/judgement                 Orientation Level: Disoriented to, Place, Time, Situation Current Attention Level: Selective Memory: Decreased short-term memory Following Commands: Follows one step commands inconsistently Safety/Judgement: Decreased awareness of safety, Decreased awareness of deficits Awareness: Intellectual Problem Solving: Decreased initiation, Requires verbal cues General Comments: Requires tactile cuing/manual facilitation to being supine>sit.     General Comments       Exercises     Shoulder Instructions      Home Living Family/patient expects to be discharged to:: Private residence Living Arrangements: Children Available Help at Discharge: Family;Available 24 hours/day Type of Home: House Home Access: Stairs to enter [employer] of Steps: 2-3   Home Layout: One level               Home Equipment: Conservation officer, nature (2 wheels)          Prior Functioning/Environment Prior Level of Function : Independent/Modified Independent;Patient poor historian/Family not available (all information taken from chart)             Mobility Comments: Pt has RW but does not use at baseline for functional mobility. Son reports she has had 3 falls in 6 months. ADLs Comments: Pt performs sink bathing but has been coming more paranoid and not doing "a great job" per son. Son assists with medication , meals, and taking pt to appointments.        OT Problem List:  Decreased strength;Decreased activity tolerance;Impaired balance (sitting and/or standing);Decreased coordination;Decreased cognition;Decreased safety awareness;Decreased knowledge of use of DME or AE;Decreased knowledge of precautions      OT Treatment/Interventions: Self-care/ADL training;Therapeutic exercise;Therapeutic activities;Energy conservation;DME and/or AE instruction;Patient/family education;Manual therapy;Balance training    OT Goals(Current goals can be found in the care plan section) Acute Rehab OT Goals Patient Stated Goal: none stated OT Goal Formulation: With patient Time For Goal Achievement: 08/18/21 Potential to Achieve Goals: Fair ADL Goals Pt Will Transfer to Toilet: with supervision Pt Will Perform Toileting - Clothing Manipulation and hygiene: with supervision Pt Will Perform Tub/Shower Transfer: with supervision  OT Frequency: Min 2X/week    Co-evaluation              AM-PAC OT "6 Clicks" Daily Activity     Outcome Measure Help from another person eating meals?: None Help from another person taking care of personal grooming?: None Help from another person toileting, which includes using toliet, bedpan, or urinal?: A Little Help from another person bathing (including washing, rinsing, drying)?: A Little Help from another person to put  on and taking off regular upper body clothing?: None Help from another person to put on and taking off regular lower body clothing?: A Little 6 Click Score: 21   End of Session Equipment Utilized During Treatment: Rolling walker (2 wheels);Gait belt Nurse Communication: Mobility status  Activity Tolerance: Patient tolerated treatment well Patient left: with call bell/phone within reach;in chair;with chair alarm set  OT Visit Diagnosis: Unsteadiness on feet (R26.81);Repeated falls (R29.6);Muscle weakness (generalized) (M62.81);History of falling (Z91.81);Other symptoms and signs involving cognitive function                 Time: 7209-4709 OT Time Calculation (min): 24 min Charges:  OT General Charges $OT Visit: 1 Visit OT Evaluation $OT Eval Moderate Complexity: 1 Mod OT Treatments $Self Care/Home Management : 8-22 mins  Gerrianne Scale, MS, OTR/L ascom [phone] 08/05/21, 11:09 AM

## 2021-08-05 NOTE — Discharge Summary (Signed)
Physician Discharge Summary   Patient: Manon Banbury MRN: [id number] DOB: 05/14/1923  Admit date:     08/02/2021  Discharge date: 08/05/21  Discharge Physician: Edwin Dada   PCP: Latanya Maudlin, NP   Recommendations at discharge:   Follow up with PCP Thornton Dales in 1 week Thornton Dales: Please continue Eliquis 86 months; afterwards, please discuss risk and benefit of continued prolonged anticoagulation for this VTE event        Discharge Diagnoses Principal Problem:   Pulmonary embolism [facility]) Active Problems:   Vascular dementia without behavioral disturbance ([place])   Hypertension   Lung infiltrate       Hospital Course   Mrs. Belvin is an 88 y.o. F with dementia, HTN, and recent COVID infection, who presented due to hypoxia.  Patient's HH RN noted patient had asymptomatic hypoxia to the 80s, sent to ER.  In the ER, CTA showed small bilateral PEs.  Started on heparin.      * Pulmonary embolism ([place])- (present on admission) Heparin started.  Echo showed no heart strain.  Weaned off O2.    Transitioned to Eliquis for discharge    Lung infiltrate CT chest showed resolving pneumonia due to COVID.  No further treatment needed.  Respiratory failure ruled out.  Bacterial pneumonia ruled out.  Antibiotics stopped, no further fever or hypoxia.  Hypertension  Vascular dementia without behavioral disturbance          Pain control - [place] Controlled Substance Reporting System database was reviewed.    Disposition: Home health Diet recommendation: Cardiac diet  DISCHARGE MEDICATION: Allergies as of 08/05/2021   No Known Allergies      Medication List     TAKE these medications    acetaminophen 650 MG CR tablet Commonly known as: TYLENOL Take 1,300 mg by mouth every 8 (eight) hours as needed for pain.   amLODipine 5 MG tablet Commonly known as: NORVASC Take 1 tablet (5 mg total) by mouth daily.   Apixaban Starter Pack (11m  and 528m Commonly known as: ELIQUIS STARTER PACK Take as directed on package: start with two-30m68mablets twice daily for 7 days. On day 8, switch to one-30mg74mblet twice daily.   benzonatate 200 MG capsule Commonly known as: TESSALON Take 1 capsule (200 mg total) by mouth 3 (three) times daily as needed for cough.   gabapentin 100 MG capsule Commonly known as: NEURONTIN Take 1 capsule (100 mg total) by mouth 2 (two) times daily. With breakfast and lunch.  Take 2 capsules (200 mg total) at bedtime What changed: how much to take   HYDROcodone-acetaminophen 7.5-325 MG tablet Commonly known as: NORCO Take 1 tablet by mouth every 8 (eight) hours as needed.   ipratropium 0.06 % nasal spray Commonly known as: ATROVENT Place 2 sprays into both nostrils 4 (four) times daily.   naloxone 4 MG/0.1ML Liqd nasal spray kit Commonly known as: NARCAN Place 4 mg into the nose once as needed.   Xtampza ER 9 MG C12a Generic drug: oxyCODONE ER Take 1 capsule by mouth at bedtime.        Follow-up Information     CowaLatanya Maudlin. Schedule an appointment as soon as possible for a visit in 1 week(s).   Specialty: Family Medicine Contact information: [phone]            Discharge Instructions     Discharge instructions   Complete by:  As directed    From Dr. Loleta Books: You were admitted for blood clots.  These were small and in the lungs on both sides. The ultrasound of your heart showed no damage from the clots (the squeeze was good).  YOu should resume your home medicines To prevent complications from this clot, you should take the blood thinner Eliquis for 3 months Take Eliquis 10 mg twice daily for 1 week then reduce to 5 mg once daily for 3 months total  Go see Thornton Dales your PCP within 1-2 weeks Discuss the plan to continue after 3 months with her or to stop  Return for bleeding, dark and tar-like poop, blood in poop,  or other bleeding.   Increase activity slowly   Complete by: As directed         Discharge Exam: Filed Weights   08/02/21 1753 08/03/21 0414  Weight: 56.7 kg 56.8 kg   General: Pt is lying in bed, awake, not in acute distress, pleasant Cardiovascular: RRR, nl S1-S2, no murmurs appreciated.   No LE edema.   Respiratory: Normal respiratory rate and rhythm.  CTAB without rales or wheezes. Abdominal: Abdomen soft and non-tender.  No distension or HSM.   Neuro/Psych: Strength symmetric in upper and lower extremities.  Judgment and insight appear impaired by dementia, at baseline.   Condition at discharge: good  The results of significant diagnostics from this hospitalization (including imaging, microbiology, ancillary and laboratory) are listed below for reference.   Imaging Studies: DG Chest 2 View  Result Date: 08/02/2021 CLINICAL DATA:  Provided history: Low oxygen saturation. Additional history provided: Patient diagnosed with pneumonia, discharge from the hospital on Monday, tested positive for COVID 07/27/2021. EXAM: CHEST - 2 VIEW COMPARISON:  Prior chest radiographs 07/27/2021 and earlier. FINDINGS: Mild cardiomegaly, unchanged. Aortic atherosclerosis. Although improved from the prior examination of 07/27/2021, there is persistent airspace disease within the right lung base. Additionally, there is suggestion of subtle patchy airspace disease within the right upper lobe, a finding not appreciated on the prior chest radiographs. Redemonstrated chronic elevation of the left hemidiaphragm. Subtle blunting of the right lateral costophrenic angle, which may reflect a trace right pleural effusion. No evidence of pneumothorax. No acute bony abnormality identified. Degenerative changes of the spine. IMPRESSION: Right lower lobe airspace disease, persistent although improved as compared to the prior chest radiographs of 07/27/2021. Radiographic follow-up to complete resolution recommended.  Suggestion of subtle patchy airspace disease within the right upper lobe, a new finding. Cardiomegaly, unchanged. Possible trace right pleural effusion. Aortic Atherosclerosis (ICD10-I70.0). Electronically Signed   By: Kellie Simmering D.O.   On: 08/02/2021 18:46   DG Chest 2 View  Result Date: 07/27/2021 CLINICAL DATA:  Shortness of breath EXAM: CHEST - 2 VIEW COMPARISON:  12/09/2019 FINDINGS: Stable mild cardiomegaly. Aortic atherosclerosis. Patchy airspace consolidation within the right lower lobe. Small right pleural effusion. Minimal atelectasis in the left lung base. Left lung is otherwise clear. No pneumothorax. IMPRESSION: Right lower lobe pneumonia with small right pleural effusion. Electronically Signed   By: Davina Poke D.O.   On: 07/27/2021 16:30   CT Angio Chest Pulmonary Embolism (PE) W or WO Contrast  Result Date: 08/02/2021 CLINICAL DATA:  Chest pain and shortness of breath with respiratory failure and hypoxia, history of COVID-19 positivity, initial encounter EXAM: CT ANGIOGRAPHY CHEST WITH CONTRAST TECHNIQUE: Multidetector CT imaging of the chest was performed using the standard protocol during bolus administration of intravenous contrast. Multiplanar CT image reconstructions and MIPs were obtained to  evaluate the vascular anatomy. RADIATION DOSE REDUCTION: This exam was performed according to the departmental dose-optimization program which includes automated exposure control, adjustment of the mA and/or kV according to patient size and/or use of iterative reconstruction technique. CONTRAST:  58m OMNIPAQUE IOHEXOL 350 MG/ML SOLN COMPARISON:  Chest x-ray from earlier in the same day. FINDINGS: Cardiovascular: Atherosclerotic calcifications of the thoracic aorta are noted. No aneurysmal dilatation or dissection is noted. Mild cardiac enlargement is noted. Coronary calcifications are seen. The pulmonary artery shows a normal branching pattern bilaterally. Filling defects are noted within  the left lower lobe pulmonary arterial branches consistent with pulmonary emboli. Smaller emboli are noted involving the right middle lobe as well. No findings of right heart strain are noted. Mediastinum/Nodes: Thoracic inlet is within normal limits. No sizable hilar or mediastinal adenopathy is noted. The esophagus as visualized is within normal limits. Lungs/Pleura: Lungs are well aerated bilaterally. Mild atelectatic changes are noted in the left lower lobe. Consolidation in the right lower lobe is noted with associated pleural effusion. Patchy infiltrates are noted within the right upper lobe as well. Upper Abdomen: Visualized upper abdomen is within normal limits. Musculoskeletal: No acute rib abnormality is noted. Degenerative changes of the thoracic spine are seen. Review of the MIP images confirms the above findings. IMPRESSION: Bilateral pulmonary emboli without definitive right heart strain. Right lower lobe consolidation with small effusion. Patchy infiltrates are noted within the right upper lobe which may be related to the recent history of COVID. Aortic Atherosclerosis (ICD10-I70.0). Critical Value/emergent results were called by telephone at the time of interpretation on 08/02/2021 at 9:12 pm to Dr. PKerman Passey, who verbally acknowledged these results. Electronically Signed   By: MInez CatalinaM.D.   On: 08/02/2021 21:15   ECHOCARDIOGRAM COMPLETE  Result Date: 08/04/2021    ECHOCARDIOGRAM REPORT   Patient Name:   DBLESSYN SOMMERVILLEDate of Exam: 08/04/2021 Medical Rec #:  [id number]      Height:       64.0 in Accession #:    [id number]     Weight:       125.2 lb Date of Birth:  [birth year]       BSA:          1.603 m Patient Age:    89years        BP:           148/69 mmHg Patient Gender: F               HR:           90 bpm. Exam Location:  [facility] Procedure: 2D Echo, Color Doppler and Cardiac Doppler Indications:     I26.09 Pulmonary Embolus  History:         Patient has no prior history of  Echocardiogram examinations.                  Signs/Symptoms:Shortness of Breath.  Sonographer:     JCharmayne SheerReferring Phys:  [id number] Phys: BSerafina RoyalsMD IMPRESSIONS  1. Left ventricular ejection fraction, by estimation, is 60 to 65%. The left ventricle has normal function. The left ventricle has no regional wall motion abnormalities. Left ventricular diastolic parameters were normal.  2. Right ventricular systolic function is normal. The right ventricular size is normal.  3. The mitral valve is normal in structure. Trivial mitral valve regurgitation.  4. The aortic valve is calcified. Aortic valve regurgitation is not visualized. FINDINGS  Left Ventricle: Left ventricular ejection fraction, by estimation, is 60 to 65%. The left ventricle has normal function. The left ventricle has no regional wall motion abnormalities. The left ventricular internal cavity size was normal in size. There is  no left ventricular hypertrophy. Left ventricular diastolic parameters were normal. Right Ventricle: The right ventricular size is normal. No increase in right ventricular wall thickness. Right ventricular systolic function is normal. Left Atrium: Left atrial size was normal in size. Right Atrium: Right atrial size was normal in size. Pericardium: There is no evidence of pericardial effusion. Mitral Valve: The mitral valve is normal in structure. Trivial mitral valve regurgitation. MV peak gradient, 8.6 mmHg. The mean mitral valve gradient is 3.0 mmHg. Tricuspid Valve: The tricuspid valve is normal in structure. Tricuspid valve regurgitation is trivial. Aortic Valve: The aortic valve is calcified. Aortic valve regurgitation is not visualized. Aortic valve mean gradient measures 12.0 mmHg. Aortic valve peak gradient measures 22.8 mmHg. Aortic valve area, by VTI measures 1.64 cm. Pulmonic Valve: The pulmonic valve was normal in structure. Pulmonic valve regurgitation is not visualized. Aorta: The  aortic root and ascending aorta are structurally normal, with no evidence of dilitation. IAS/Shunts: No atrial level shunt detected by color flow Doppler.  LEFT VENTRICLE PLAX 2D LVIDd:         3.55 cm   Diastology LVIDs:         2.17 cm   LV e' medial:    5.44 cm/s LV PW:         1.52 cm   LV E/e' medial:  12.8 LV IVS:        0.95 cm   LV e' lateral:   4.68 cm/s LVOT diam:     2.00 cm   LV E/e' lateral: 14.8 LV SV:         70 LV SV Index:   44 LVOT Area:     3.14 cm  LEFT ATRIUM             Index LA diam:        2.90 cm 1.81 cm/m LA Vol (A2C):   29.8 ml 18.59 ml/m LA Vol (A4C):   43.8 ml 27.32 ml/m LA Biplane Vol: 38.7 ml 24.14 ml/m  AORTIC VALVE                     PULMONIC VALVE AV Area (Vmax):    1.41 cm      PV Vmax:       1.02 m/s AV Area (Vmean):   1.57 cm      PV Vmean:      73.100 cm/s AV Area (VTI):     1.64 cm      PV VTI:        0.194 m AV Vmax:           239.00 cm/s   PV Peak grad:  4.2 mmHg AV Vmean:          164.000 cm/s  PV Mean grad:  2.0 mmHg AV VTI:            0.430 m AV Peak Grad:      22.8 mmHg AV Mean Grad:      12.0 mmHg LVOT Vmax:         107.00 cm/s LVOT Vmean:        81.700 cm/s LVOT VTI:          0.224 m LVOT/AV VTI ratio: 0.52  AORTA Ao Root  diam: 3.10 cm MITRAL VALVE                TRICUSPID VALVE MV Area (PHT): 3.27 cm     TR Peak grad:   24.8 mmHg MV Area VTI:   2.14 cm     TR Vmax:        249.00 cm/s MV Peak grad:  8.6 mmHg MV Mean grad:  3.0 mmHg     SHUNTS MV Vmax:       1.47 m/s     Systemic VTI:  0.22 m MV Vmean:      75.4 cm/s    Systemic Diam: 2.00 cm MV Decel Time: 232 msec MV E velocity: 69.40 cm/s MV A velocity: 119.00 cm/s MV E/A ratio:  0.58 Serafina Royals MD Electronically signed by Serafina Royals MD Signature Date/Time: 08/04/2021/4:01:05 PM    Final     Microbiology: Results for orders placed or performed during the hospital encounter of 08/02/21  Blood culture (routine x 2)     Status: None (Preliminary result)   Collection Time: 08/02/21  8:32 PM    Specimen: BLOOD  Result Value Ref Range Status   Specimen Description BLOOD RIGHT ANTECUBITAL  Final   Special Requests   Final    BOTTLES DRAWN AEROBIC AND ANAEROBIC Blood Culture adequate volume   Culture   Final    NO GROWTH 3 DAYS Performed at [facility], [address] [place], [place] [address]    Report Status PENDING  Incomplete  Blood culture (routine x 2)     Status: None (Preliminary result)   Collection Time: 08/02/21  8:32 PM   Specimen: BLOOD  Result Value Ref Range Status   Specimen Description BLOOD LEFT ANTECUBITAL  Final   Special Requests   Final    BOTTLES DRAWN AEROBIC AND ANAEROBIC Blood Culture adequate volume   Culture   Final    NO GROWTH 3 DAYS Performed at [facility], [address] [place],  [address]    Report Status PENDING  Incomplete  MRSA Next Gen by PCR, Nasal     Status: None   Collection Time: 08/02/21 10:49 PM   Specimen: Nasal Mucosa; Nasal Swab  Result Value Ref Range Status   MRSA by PCR Next Gen NOT DETECTED NOT DETECTED Final    Comment: (NOTE) The GeneXpert MRSA Assay (FDA approved for NASAL specimens only), is one component of a comprehensive MRSA colonization surveillance program. It is not intended to diagnose MRSA infection nor to guide or monitor treatment for MRSA infections. Test performance is not FDA approved in patients less than 93 years old. Performed at [facility], [address] [place],  [address]     Labs: CBC: Recent Labs  Lab 08/02/21 1844 08/03/21 0153 08/03/21 0622 08/04/21 0844 08/05/21 0551  WBC 30.5* 26.0* 23.0* 19.9* 14.1*  NEUTROABS  --   --  19.9* 16.2*  --   HGB 12.3 11.5* 11.8* 11.1* 11.3*  HCT 38.0 34.8* 35.3* 32.9* 34.2*  MCV 99.0 95.9 95.9 92.9 95.3  PLT 412* 444* 457* 477* 474*   Basic Metabolic Panel: Recent Labs  Lab 07/30/21 0338 07/31/21 0441 08/02/21 1844 08/03/21 0153 08/03/21 0622 08/04/21 0554  NA 133* 132* 134*  --  135  135  K 3.7 3.8 4.6  --  3.6 3.6  CL 97* 97* 101  --  98 103  CO2 _0 --  27 22  GLUCOSE 183* 158* 139*  --  122* 120*  BUN 15 24* 45*  --  32* 17  CREATININE 0.42* 0.54 0.95 0.75 0.67 0.58  CALCIUM 8.8* 9.1 8.8*  --  8.6* 8.5*  MG  --   --   --   --  2.0 2.1  PHOS  --   --   --   --  2.6 2.7   Liver Function Tests: Recent Labs  Lab 08/03/21 0622 08/04/21 0554  AST 12* 17  ALT 12 12  ALKPHOS 52 63  BILITOT 0.7 0.7  PROT 6.5 7.0  ALBUMIN 3.1* 3.5   CBG: No results for input(s): GLUCAP in the last 168 hours.  Discharge time spent: 35 minutes.  Signed: Edwin Dada, MD [facility] 08/05/2021

## 2021-08-05 NOTE — Progress Notes (Signed)
Discharge Note: Reviewed discharge instructions with son. Son verbalized understanding. Pt discharged with all personal belongings. Staff Wheeled pt out. Pt transported to home via family vehicle.

## 2021-08-05 NOTE — Discharge Instructions (Signed)
Information on my medicine - ELIQUIS (apixaban)  This medication education was reviewed with me or my healthcare representative as part of my  discharge preparation. The pharmacist that spoke with me during my hospital stay was:  Cordella Register, PharmD (pharmacist name) WHY WAS ELIQUIS PRESCRIBED FOR YOU?  Eliquis was prescribed to treat blood clots that may have been found in the veins of your legs  (deep vein thrombosis) or in your lungs (pulmonary embolism) and to reduce the risk of them  occurring again. WHAT DO YOU NEED TO KNOW ABOUT ELIQUIS ?  The starting dose is 10 mg (two 5 mg tablets) taken TWICE daily for the FIRST SEVEN (7)  DAYS, then on (enter date) _____January 27th_________ the dose is reduced to ONE 5 mg tablet taken  TWICE daily. Eliquis may be taken with or without food.  Try to take the dose about the same time in the morning and in the evening. If you have difficulty  swallowing the tablet whole please discuss with your pharmacist how to take the medication  safely. Take Eliquis exactly as prescribed and DO NOT stop taking Eliquis without talking to the doctor  who prescribed the medication. Stopping may increase your risk of developing a new blood  clot. Refill your prescription before you run out.  After discharge, you should have regular check-up appointments with your healthcare  provider that is prescribing your Eliquis.  WHAT DO YOU DO IF YOU MISS A DOSE? If a dose of ELIQUIS is not taken at the scheduled time, take it as soon as possible on the same  day and twice-daily administration should be resumed. The dose should not be doubled to make  up for a missed dose.   IMPORTANT SAFETY INFORMATION A possible side effect of Eliquis is bleeding. You should call your healthcare provider right away  if you experience any of the following:  ? Bleeding from an injury or your nose that does not stop. ? Unusual colored urine (red or dark brown) or unusual  colored stools (red or black). ? Unusual bruising for unknown reasons. ? A serious fall or if you hit your head (even if there is no bleeding). Some medicines may interact with Eliquis and might increase your risk of bleeding or clotting  while on Eliquis. To help avoid this, consult your healthcare provider or pharmacist prior to using  any new prescription or non-prescription medications, including herbals, vitamins, non-steroidal  anti-inflammatory drugs (NSAIDs) and supplements.  This website has more information on Eliquis (apixaban): [email or web address].com.cy.

## 2021-08-07 LAB — CULTURE, BLOOD (ROUTINE X 2)
Culture: NO GROWTH
Culture: NO GROWTH
Special Requests: ADEQUATE
Special Requests: ADEQUATE

## 2021-11-19 ENCOUNTER — Emergency Department: Payer: Medicare Other

## 2021-11-19 ENCOUNTER — Emergency Department
Admission: EM | Admit: 2021-11-19 | Discharge: 2021-11-19 | Disposition: A | Discharge status: Home / Self Care | Payer: Medicare Other | Attending: Emergency Medicine | Admitting: Emergency Medicine

## 2021-11-19 ENCOUNTER — Other Ambulatory Visit: Payer: Self-pay

## 2021-11-19 DIAGNOSIS — R935 Abnormal findings on diagnostic imaging of other abdominal regions, including retroperitoneum: Secondary | ICD-10-CM | POA: Diagnosis not present

## 2021-11-19 DIAGNOSIS — I700 Atherosclerosis of aorta: Secondary | ICD-10-CM | POA: Insufficient documentation

## 2021-11-19 DIAGNOSIS — R197 Diarrhea, unspecified: Secondary | ICD-10-CM | POA: Insufficient documentation

## 2021-11-19 DIAGNOSIS — Z79899 Other long term (current) drug therapy: Secondary | ICD-10-CM | POA: Insufficient documentation

## 2021-11-19 DIAGNOSIS — I10 Essential (primary) hypertension: Secondary | ICD-10-CM | POA: Diagnosis not present

## 2021-11-19 DIAGNOSIS — Z8616 Personal history of COVID-19: Secondary | ICD-10-CM | POA: Insufficient documentation

## 2021-11-19 DIAGNOSIS — I119 Hypertensive heart disease without heart failure: Secondary | ICD-10-CM | POA: Diagnosis not present

## 2021-11-19 DIAGNOSIS — F0390 Unspecified dementia without behavioral disturbance: Secondary | ICD-10-CM | POA: Diagnosis not present

## 2021-11-19 LAB — CBC WITH DIFFERENTIAL/PLATELET
Abs Immature Granulocytes: 0.03 10*3/uL (ref 0.00–0.07)
Basophils Absolute: 0 10*3/uL (ref 0.0–0.1)
Basophils Relative: 0 %
Eosinophils Absolute: 0.3 10*3/uL (ref 0.0–0.5)
Eosinophils Relative: 3 %
HCT: 36.8 % (ref 36.0–46.0)
Hemoglobin: 11.8 g/dL — ABNORMAL LOW (ref 12.0–15.0)
Immature Granulocytes: 0 %
Lymphocytes Relative: 16 %
Lymphs Abs: 1.6 10*3/uL (ref 0.7–4.0)
MCH: 31.5 pg (ref 26.0–34.0)
MCHC: 32.1 g/dL (ref 30.0–36.0)
MCV: 98.1 fL (ref 80.0–100.0)
Monocytes Absolute: 1 10*3/uL (ref 0.1–1.0)
Monocytes Relative: 11 %
Neutro Abs: 6.7 10*3/uL (ref 1.7–7.7)
Neutrophils Relative %: 70 %
Platelets: 262 10*3/uL (ref 150–400)
RBC: 3.75 MIL/uL — ABNORMAL LOW (ref 3.87–5.11)
RDW: 11.6 % (ref 11.5–15.5)
WBC: 9.6 10*3/uL (ref 4.0–10.5)
nRBC: 0 % (ref 0.0–0.2)

## 2021-11-19 LAB — LIPASE, BLOOD: Lipase: 22 U/L (ref 11–51)

## 2021-11-19 LAB — COMPREHENSIVE METABOLIC PANEL
ALT: 11 U/L (ref 0–44)
AST: 18 U/L (ref 15–41)
Albumin: 3.8 g/dL (ref 3.5–5.0)
Alkaline Phosphatase: 35 U/L — ABNORMAL LOW (ref 38–126)
Anion gap: 7 (ref 5–15)
BUN: 29 mg/dL — ABNORMAL HIGH (ref 8–23)
CO2: 25 mmol/L (ref 22–32)
Calcium: 9.1 mg/dL (ref 8.9–10.3)
Chloride: 106 mmol/L (ref 98–111)
Creatinine, Ser: 0.85 mg/dL (ref 0.44–1.00)
GFR, Estimated: >60 mL/min (ref >60)
Glucose, Bld: 117 mg/dL — ABNORMAL HIGH (ref 70–99)
Potassium: 4.2 mmol/L (ref 3.5–5.1)
Sodium: 138 mmol/L (ref 135–145)
Total Bilirubin: 0.6 mg/dL (ref 0.3–1.2)
Total Protein: 6.7 g/dL (ref 6.5–8.1)

## 2021-11-19 LAB — URINALYSIS, ROUTINE W REFLEX MICROSCOPIC
Bilirubin Urine: NEGATIVE
Glucose, UA: NEGATIVE mg/dL
Hgb urine dipstick: NEGATIVE
Ketones, ur: NEGATIVE mg/dL
Leukocytes,Ua: NEGATIVE
Nitrite: NEGATIVE
Protein, ur: NEGATIVE mg/dL
Specific Gravity, Urine: 1.017 (ref 1.005–1.030)
pH: 5 (ref 5.0–8.0)

## 2021-11-19 LAB — TROPONIN I (HIGH SENSITIVITY)
Troponin I (High Sensitivity): 4 ng/L (ref <18)
Troponin I (High Sensitivity): 4 ng/L (ref <18)

## 2021-11-19 LAB — C DIFFICILE QUICK SCREEN W PCR REFLEX
C Diff antigen: NEGATIVE
C Diff interpretation: NOT DETECTED
C Diff toxin: NEGATIVE

## 2021-11-19 MED ORDER — CIPROFLOXACIN HCL 250 MG PO TABS: 250.0000 mg | ORAL_TABLET | Freq: Two times a day (BID) | ORAL | 0 refills | Status: AC

## 2021-11-19 MED ORDER — IOHEXOL 300 MG/ML  SOLN
100.0000 mL | Freq: Once | INTRAMUSCULAR | Status: AC | PRN
  Administered 2021-11-19: 100 mL via INTRAVENOUS

## 2021-11-19 MED ORDER — SODIUM CHLORIDE 0.9 % IV BOLUS
1000.0000 mL | Freq: Once | INTRAVENOUS | Status: AC
  Administered 2021-11-19: 1000 mL via INTRAVENOUS

## 2021-11-19 MED ORDER — CIPROFLOXACIN HCL 500 MG PO TABS
250.0000 mg | ORAL_TABLET | Freq: Once | ORAL | Status: AC
  Administered 2021-11-19: 250 mg via ORAL
  Filled 2021-11-19: qty 1

## 2021-11-19 NOTE — ED Triage Notes (Signed)
Pt presents to ER with son with c/o diarrhea since 5/5.  Pt has been taking imodium at home without any relief.  Pt denies any abd pain or vomiting.  Pt denies any recent abx use.  Pt is A&O x2 at this time.  In NAD.   ?

## 2021-11-19 NOTE — ED Provider Notes (Signed)
? ?[facility] ?Provider Note ? ? ? Event Date/Time  ? First MD Initiated Contact with Patient 11/19/21 0249   ?  (approximate) ? ? ?History  ? ?Diarrhea ? ?History obtained via patient and her son ? ?HPI ? ?Hannah Dillon is a 88 y.o. female who presents to the ED from home with a chief complaint of diarrhea x1 day.  Taking Imodium at home without relief of symptoms.  Denies associated fever, cough, chest pain, shortness of breath, abdominal pain, nausea, vomiting or dysuria. ?  ? ? ?Past Medical History  ? ?Past Medical History:  ?Diagnosis Date  ?? Arthritis   ?? Cervical radiculopathy   ?? COVID-19 virus infection 07/27/2021  ?? DJD (degenerative joint disease)   ?? Left arm pain   ? ? ? ?Active Problem List  ? ?Patient Active Problem List  ? Diagnosis Date Noted  ?? Lung infiltrate 08/04/2021  ?? Pulmonary embolism (HCC) 08/02/2021  ?? Generalized weakness 07/30/2021  ?? Chronic pain 07/30/2021  ?? Hypertension 07/30/2021  ?? Degenerative joint disease   ?? Cervical radiculopathy   ?? Vascular dementia without behavioral disturbance (HCC) 03/11/2019  ? ? ? ?Past Surgical History  ?History reviewed. No pertinent surgical history. ? ? ?Home Medications  ? ?Prior to Admission medications   ?Medication Sig Start Date End Date Taking? Authorizing Provider  ?acetaminophen (TYLENOL) 650 MG CR tablet Take 1,300 mg by mouth every 8 (eight) hours as needed for pain.    [provider]  ?amLODipine (NORVASC) 5 MG tablet Take 1 tablet (5 mg total) by mouth daily. 08/01/21   Pennie Banter, DO  ?APIXABAN (ELIQUIS) VTE STARTER PACK (10MG  AND 5MG ) Take as directed on package: start with two-5mg  tablets twice daily for 7 days. On day 8, switch to one-5mg  tablet twice daily. 08/05/21   Danford, , MD  ?benzonatate (TESSALON) 200 MG capsule Take 1 capsule (200 mg total) by mouth 3 (three) times daily as needed for cough. 07/18/21   Earl Lites, PA-C  ?gabapentin (NEURONTIN) 100 MG  capsule Take 1 capsule (100 mg total) by mouth 2 (two) times daily. With breakfast and lunch.  Take 2 capsules (200 mg total) at bedtime ?Patient taking differently: Take 100-200 mg by mouth 2 (two) times daily. With breakfast and lunch.  Take 2 capsules (200 mg total) at bedtime 07/31/21   Shirlee Latch A, DO  ?HYDROcodone-acetaminophen (NORCO) 7.5-325 MG tablet Take 1 tablet by mouth every 8 (eight) hours as needed. 06/15/21   [provider]  ?ipratropium (ATROVENT) 0.06 % nasal spray Place 2 sprays into both nostrils 4 (four) times daily. 07/18/21   14/1/22, PA-C  ?naloxone [facility]) nasal spray 4 mg/0.1 mL Place 4 mg into the nose once as needed. 02/23/21   [provider]  ?XTAMPZA ER 9 MG C12A Take 1 capsule by mouth at bedtime. 06/07/21   [provider]  ? ? ? ?Allergies  ?Patient has no known allergies. ? ? ?Family History  ?History reviewed. No pertinent family history. ? ? ?Physical Exam  ?Triage Vital Signs: ?ED Triage Vitals [11/19/21 0249]  ?Enc Vitals Group  ?   BP 134/63  ?   Pulse Rate 61  ?   Resp 16  ?   Temp 97.7 ?F (36.5 ?C)  ?   Temp Source Oral  ?   SpO2 95 %  ?   Weight   ?   Height   ?   Head Circumference   ?  Peak Flow   ?   Pain Score   ?   Pain Loc   ?   Pain Edu?   ?   Excl. in [place]?   ? ? ?Updated Vital Signs: ?BP 127/69   Pulse 63   Temp 97.7 ?F (36.5 ?C) (Oral)   Resp 14   Ht 5\' 4"  (1.626 m)   Wt 61.4 kg   SpO2 96%   BMI 23.24 kg/m?  ? ? ?General: Awake, no distress.  ?CV:  RRR.  Good peripheral perfusion.  ?Resp:  Normal effort.  CTA B. ?Abd:  Nontender to light or deep palpation.  No distention.  ?Other:  Mildly dry mucous membranes.  No vesicles. ? ? ?ED Results / Procedures / Treatments  ?Labs ?(all labs ordered are listed, but only abnormal results are displayed) ?Labs Reviewed  ?CBC WITH DIFFERENTIAL/PLATELET - Abnormal; Notable for the following components:  ?    Result Value  ? RBC 3.75 (*)   ? Hemoglobin 11.8 (*)   ? All other components  within normal limits  ?COMPREHENSIVE METABOLIC PANEL - Abnormal; Notable for the following components:  ? Glucose, Bld 117 (*)   ? BUN 29 (*)   ? Alkaline Phosphatase 35 (*)   ? All other components within normal limits  ?URINALYSIS, ROUTINE W REFLEX MICROSCOPIC - Abnormal; Notable for the following components:  ? Color, Urine YELLOW (*)   ? APPearance CLEAR (*)   ? All other components within normal limits  ?C DIFFICILE QUICK SCREEN W PCR REFLEX    ?GASTROINTESTINAL PANEL BY PCR, STOOL (REPLACES STOOL CULTURE)  ?LIPASE, BLOOD  ?TROPONIN I (HIGH SENSITIVITY)  ?TROPONIN I (HIGH SENSITIVITY)  ? ? ? ?EKG ? ?ED ECG REPORT ?I, Irean HongSUNG,Ledell Codrington J, the attending physician, personally viewed and interpreted this ECG. ? ? Date: 11/19/2021 ? EKG Time: 0325 ? Rate: 57 ? Rhythm: sinus bradycardia ? Axis: Normal ? Intervals:none ? ST&T Change: Nonspecific ? ? ? ?RADIOLOGY ?I have independently visualized and interpreted patient's CT scan as well as noted the radiology interpretation: ? ?CT abdomen pelvis: Retained stool, possible proctitis ? ?Official radiology report(s): ?CT Abdomen Pelvis W Contrast ? ?Result Date: 11/19/2021 ?CLINICAL DATA:  88 year old female with several days of diarrhea, not improving with Imodium. EXAM: CT ABDOMEN AND PELVIS WITH CONTRAST TECHNIQUE: Multidetector CT imaging of the abdomen and pelvis was performed using the standard protocol following bolus administration of intravenous contrast. RADIATION DOSE REDUCTION: This exam was performed according to the departmental dose-optimization program which includes automated exposure control, adjustment of the mA and/or kV according to patient size and/or use of iterative reconstruction technique. CONTRAST:  100mL OMNIPAQUE IOHEXOL 300 MG/ML  SOLN COMPARISON:  CTA chest 08/02/2021. FINDINGS: Lower chest: Cardiomegaly is mild-to-moderate. No pericardial effusion. Platelike middle and lower lobe lung base opacity greater on the left most resembles atelectasis or  scarring. No pleural effusion. Hepatobiliary: Gallbladder and bile ducts are distended. There is no pericholecystic inflammation. The CBD is up to 12 mm and slowly tapers distally on coronal image 42. There is mild to moderate central intrahepatic biliary ductal dilatation. No gallstone or obstructing etiology identified. Liver enhancement remains normal. Pancreas: Atrophied. Spleen: Negative. Adrenals/Urinary Tract: Adrenal glands and kidneys are within normal limits for age. Renal enhancement and contrast excretion is symmetric. Urinary bladder is mildly distended with mass effect from enlarged distal colon. Stomach/Bowel: Elongated, impacted stool in the distal sigmoid colon and rectum (sagittal image 68) with superimposed rectal mucosal thickening and hyperenhancement distally (series 2,  image 77). See also coronal image 70. Upstream dilated, highly redundant gas-filled sigmoid colon. Highly redundant mostly gas containing but less distended descending colon. Similar gas and occasional retained stool throughout the transverse colon. Retained stool in redundant right colon. No large bowel volvulus identified. No convincing large bowel inflammation outside of the rectal mucosa. Diminutive or absent appendix. Terminal ileum is decompressed. Nondilated small bowel, intermittently loops in the left abdomen are fluid containing. Stomach is decompressed. Duodenum is decompressed. No free air or free fluid identified. Vascular/Lymphatic: Extensive Aortoiliac calcified atherosclerosis. Major arterial structures in the abdomen and pelvis remain patent. No lymphadenopathy identified. Portal venous system is patent. Reproductive: Within normal limits; small dystrophic calcification at the uterine fundus. Other: No pelvic free fluid. Musculoskeletal: Degenerative changes in the spine. No acute or suspicious osseous lesion identified. IMPRESSION: 1. Dilated and redundant large bowel, especially throughout the lower abdomen  and pelvis. No volvulus, but abundant retained stool in the rectosigmoid colon and conspicuous mucosal hyperenhancement of the rectum. This might reflect a Fecal Impaction with superimposed Acute Proctitis. But an

## 2021-11-19 NOTE — ED Notes (Signed)
Discharge instructions discussed with caregiver son. Printed prescription provided to son. Caregiver verbalized understanding with no questions at this time. Pt to go home with caregiver/son at bedside. ?

## 2021-11-19 NOTE — ED Notes (Signed)
Pt had 1 large watery bowel movement. Peri care/bed bath provided. Repositioned pt in clean bed with new sheets and gown. Stool sample walked to by this RN ?

## 2021-11-19 NOTE — ED Notes (Signed)
Patient transported to CT 

## 2021-11-19 NOTE — ED Notes (Signed)
Lab requests stool sample for GI PCR be recollect. MD informed unable to send sample as pt has not had another BM since arrival ?

## 2021-11-19 NOTE — Discharge Instructions (Signed)
1.  Take Cipro 250mg  twice daily x3 days.  Take your next dose around dinnertime tonight. ?2.  BRAT diet x3 days, then slowly advance diet as tolerated. ?3.  After your stools firm up, take these over-the-counter medications daily to regulate your bowel movements: ?MiraLAX ?Stool softener such as Colace ?4.  Return to the ER for worsening symptoms, persistent vomiting, difficulty breathing or other concerns. ?

## 2022-04-22 ENCOUNTER — Ambulatory Visit
Admission: EM | Admit: 2022-04-22 | Discharge: 2022-04-22 | Disposition: A | Discharge status: Home / Self Care | Payer: Medicare Other | Attending: Emergency Medicine | Admitting: Emergency Medicine

## 2022-04-22 DIAGNOSIS — S61211A Laceration without foreign body of left index finger without damage to nail, initial encounter: Secondary | ICD-10-CM

## 2022-04-22 MED ORDER — TETANUS-DIPHTHERIA TOXOIDS TD 5-2 LFU IM INJ
0.5000 mL | INJECTION | Freq: Once | INTRAMUSCULAR | Status: AC
  Administered 2022-04-22: 0.5 mL via INTRAMUSCULAR

## 2022-04-22 MED ORDER — CEPHALEXIN 500 MG PO CAPS: 500.0000 mg | ORAL_CAPSULE | Freq: Two times a day (BID) | ORAL | 0 refills | Status: AC

## 2022-04-22 NOTE — ED Provider Notes (Signed)
[facility]    CSN: [id number] Arrival date & time: 04/22/22  1451      History   Chief Complaint Chief Complaint  Patient presents with   Laceration    HPI Nylasia Bertolini is a 88 y.o. female.   HPI  88 year old female here for evaluation of finger laceration.  Patient is here with her son for evaluation of a laceration that she sustained to the distal pad of her left index finger approximately 45 minutes prior to arrival.  The exact mechanism is unsure but her son suspects that she may have caught it on a metal chair.  They are unsure of when her last tetanus shot was.  Patient is still bleeding and she is on Eliquis.  Past Medical History:  Diagnosis Date   Arthritis    Cervical radiculopathy    COVID-19 virus infection 07/27/2021   DJD (degenerative joint disease)    Left arm pain     Patient Active Problem List   Diagnosis Date Noted   Lung infiltrate 08/04/2021   Pulmonary embolism ([place]) 08/02/2021   Generalized weakness 07/30/2021   Chronic pain 07/30/2021   Hypertension 07/30/2021   Degenerative joint disease    Cervical radiculopathy    Vascular dementia without behavioral disturbance ([place]) 03/11/2019    History reviewed. No pertinent surgical history.  OB History   No obstetric history on file.      Home Medications    Prior to Admission medications   Medication Sig Start Date End Date Taking? Authorizing Provider  amLODipine (NORVASC) 5 MG tablet Take 1 tablet (5 mg total) by mouth daily. 08/01/21  Yes Ezekiel Slocumb, DO  APIXABAN [facility]) VTE STARTER PACK (10MG  AND 5MG ) Take as directed on package: start with two-5mg  tablets twice daily for 7 days. On day 8, switch to one-5mg  tablet twice daily. 08/05/21  Yes Danford, Suann Larry, MD  cephALEXin (KEFLEX) 500 MG capsule Take 1 capsule (500 mg total) by mouth 2 (two) times daily for 5 days. 04/22/22 04/27/22 Yes Margarette Canada, NP  gabapentin (NEURONTIN) 100 MG capsule Take 1 capsule  (100 mg total) by mouth 2 (two) times daily. With breakfast and lunch.  Take 2 capsules (200 mg total) at bedtime Patient taking differently: Take 100-200 mg by mouth 2 (two) times daily. With breakfast and lunch.  Take 2 capsules (200 mg total) at bedtime 07/31/21  Yes Nicole Kindred A, DO  ipratropium (ATROVENT) 0.06 % nasal spray Place 2 sprays into both nostrils 4 (four) times daily. 07/18/21  Yes Laurene Footman B, PA-C  naloxone [facility]) nasal spray 4 mg/0.1 mL Place 4 mg into the nose once as needed. 02/23/21  Yes [provider]  XTAMPZA ER 9 MG C12A Take 1 capsule by mouth at bedtime. 06/07/21  Yes [provider]  acetaminophen (TYLENOL) 650 MG CR tablet Take 1,300 mg by mouth every 8 (eight) hours as needed for pain.    [provider]  benzonatate (TESSALON) 200 MG capsule Take 1 capsule (200 mg total) by mouth 3 (three) times daily as needed for cough. 07/18/21   Danton Clap, PA-C  HYDROcodone-acetaminophen (NORCO) 7.5-325 MG tablet Take 1 tablet by mouth every 8 (eight) hours as needed. 06/15/21   [provider]    Family History History reviewed. No pertinent family history.  Social History Social History   Tobacco Use   Smoking status: Never   Smokeless tobacco: Never  Vaping Use   Vaping Use: Never used  Substance Use Topics   Alcohol use: No   Drug use: No     Allergies   Patient has no known allergies.   Review of Systems Review of Systems  Skin:  Positive for wound. Negative for color change.  Neurological:  Negative for weakness and numbness.     Physical Exam Triage Vital Signs ED Triage Vitals  Enc Vitals Group     BP      Pulse      Resp      Temp      Temp src      SpO2      Weight      Height      Head Circumference      Peak Flow      Pain Score      Pain Loc      Pain Edu?      Excl. in [place]?    No data found.  Updated Vital Signs BP (!) 153/67 (BP Location: Right Arm)   Pulse 67   Temp 98.2 F  (36.8 C) (Oral)   Ht 5' (1.524 m)   Wt 130 lb (59 kg)   SpO2 97%   BMI 25.39 kg/m   Visual Acuity Right Eye Distance:   Left Eye Distance:   Bilateral Distance:    Right Eye Near:   Left Eye Near:    Bilateral Near:     Physical Exam Vitals and nursing note reviewed.  Constitutional:      Appearance: Normal appearance.  Musculoskeletal:        General: Tenderness and signs of injury present. No swelling or deformity. Normal range of motion.  Skin:    General: Skin is warm.     Capillary Refill: Capillary refill takes less than 2 seconds.  Neurological:     General: No focal deficit present.     Mental Status: She is alert and oriented to person, place, and time.  Psychiatric:        Mood and Affect: Mood normal.        Behavior: Behavior normal.        Thought Content: Thought content normal.        Judgment: Judgment normal.      UC Treatments / Results  Labs (all labs ordered are listed, but only abnormal results are displayed) Labs Reviewed - No data to display  EKG   Radiology No results found.  Procedures Procedures (including critical care time)  Medications Ordered in UC Medications  tetanus & diphtheria toxoids (adult) (TENIVAC) injection 0.5 mL (has no administration in time range)    Initial Impression / Assessment and Plan / UC Course  I have reviewed the triage vital signs and the nursing notes.  Pertinent labs & imaging results that were available during my care of the patient were reviewed by me and considered in my medical decision making (see chart for details).   Patient is a pleasant, nontoxic-appearing 88 year old female here for evaluation of a laceration she sustained to the distal pad of her left index finger 45 minutes prior to arrival.  The wound travels in a diagonal fashion from the lateral aspect to the medial aspect but stops before the crease of the DIP joint on the pad of the left index finger.  There is active bleeding  from the wound.  She has sensation in her tip and she has full range of motion.  Laceration wound margins were anesthetized with 1.5 mL of 1%  lidocaine without epi and good anesthesia was achieved.  The wound was then cleansed with chlorhexidine and saline, draped in a sterile fashion, and I closed the laceration with 3 simple interrupted sutures of 6-0 Prolene with close approximation.  The wound was then cleansed with chlorhexidine and saline again dressed with bacitracin, nonadherent pad, and Coban.  We will discharge patient home on Keflex twice daily for 5 days to prevent infection.  Wound care and return instructions reviewed with the patient and family.   Final Clinical Impressions(s) / UC Diagnoses   Final diagnoses:  Laceration of left index finger without foreign body without damage to nail, initial encounter     Discharge Instructions      Leave the dressing in place for the next 24 hours.  After 24 hours remove the dressing, wash the wound with warm water and soap, pat it dry, and apply a thin smear of bacitracin.  Clean the wound daily and apply bacitracin for the first 2 days.  After that a scab should have started to form and you can leave the wound open to air when you are at home and cover with a Band-Aid when you go out in public.  Take the Keflex twice daily for 5 days for prevention of wound infection.  Sutures remain in place for 10 days, please return here or see your primary care provider for removal.  If you develop any redness at the wound site, swelling, pain, drainage, red streaks going up your hand, or fever please return for reevaluation.      ED Prescriptions     Medication Sig Dispense Auth. Provider   cephALEXin (KEFLEX) 500 MG capsule Take 1 capsule (500 mg total) by mouth 2 (two) times daily for 5 days. 10 capsule Margarette Canada, NP      PDMP not reviewed this encounter.   Margarette Canada, NP 04/22/22 [phone]

## 2022-04-22 NOTE — Discharge Instructions (Addendum)
Leave the dressing in place for the next 24 hours.  After 24 hours remove the dressing, wash the wound with warm water and soap, pat it dry, and apply a thin smear of bacitracin.  Clean the wound daily and apply bacitracin for the first 2 days.  After that a scab should have started to form and you can leave the wound open to air when you are at home and cover with a Band-Aid when you go out in public.  Take the Keflex twice daily for 5 days for prevention of wound infection.  Sutures remain in place for 10 days, please return here or see your primary care provider for removal.  If you develop any redness at the wound site, swelling, pain, drainage, red streaks going up your hand, or fever please return for reevaluation.  

## 2022-04-22 NOTE — ED Triage Notes (Addendum)
Pt  here with son, states laceration to LT index finger unsure if she cut it with metal chair DOI: 04/22/22

## 2022-05-06 ENCOUNTER — Ambulatory Visit
Admission: EM | Admit: 2022-05-06 | Discharge: 2022-05-06 | Disposition: A | Discharge status: Home / Self Care | Payer: Medicare Other | Attending: Internal Medicine | Admitting: Internal Medicine

## 2022-05-06 DIAGNOSIS — S61211D Laceration without foreign body of left index finger without damage to nail, subsequent encounter: Secondary | ICD-10-CM | POA: Diagnosis not present

## 2022-05-06 DIAGNOSIS — Z4802 Encounter for removal of sutures: Secondary | ICD-10-CM

## 2022-05-06 NOTE — ED Triage Notes (Signed)
Patient here to have 3 sutures removed from her left index finger today. No signs of infection.  No fevers

## 2023-01-30 ENCOUNTER — Ambulatory Visit (INDEPENDENT_AMBULATORY_CARE_PROVIDER_SITE_OTHER): Payer: Medicare Other

## 2023-01-30 ENCOUNTER — Encounter: Payer: Self-pay | Admitting: Emergency Medicine

## 2023-01-30 ENCOUNTER — Ambulatory Visit
Admission: EM | Admit: 2023-01-30 | Discharge: 2023-01-30 | Disposition: A | Discharge status: Home / Self Care | Payer: Medicare Other

## 2023-01-30 DIAGNOSIS — R600 Localized edema: Secondary | ICD-10-CM

## 2023-01-30 DIAGNOSIS — M25572 Pain in left ankle and joints of left foot: Secondary | ICD-10-CM

## 2023-01-30 NOTE — ED Triage Notes (Signed)
Pt presents with left ankle swelling and pain for several weeks.

## 2023-01-30 NOTE — Discharge Instructions (Addendum)
Your x-rays did not show any bony abnormality or joint degeneration that would be causing the pain and swelling.  I do believe that your swelling is a result of poor peripheral circulation.  Keep your legs elevated is much as possible to help reduce swelling.  Also wear compression socks to help squeeze the fluid back into your circulation so that your body can clear.  Make an appointment with your primary care doctor for reevaluation should your swelling continue.  If you develop any chest pain or shortness of breath I recommend she go to the ER for evaluation.

## 2023-01-30 NOTE — ED Provider Notes (Signed)
[facility]    CSN: [id number] Arrival date & time: 01/30/23  1320      History   Chief Complaint Chief Complaint  Patient presents with   Joint Swelling    HPI Hannah Dillon is a 89 y.o. female.   HPI  89 year old female with past medical history significant for vascular dementia, hypertension, PE, degenerative joint disease, cervical radiculopathy, and chronic pain presents for evaluation of pain and swelling in the left ankle.  The patient is bearing weight and walks with a limp.  She is with her son and reports that she has had several falls but he reports he cannot relate a specific fall to the pain and swelling in her left ankle.  He has not noticed any bruising.  Patient is not currently taking Eliquis.  She denies any chest pain or shortness of breath.  No history of CHF.  Past Medical History:  Diagnosis Date   Arthritis    Cervical radiculopathy    COVID-19 virus infection 07/27/2021   DJD (degenerative joint disease)    Left arm pain     Patient Active Problem List   Diagnosis Date Noted   Lung infiltrate 08/04/2021   Pulmonary embolism (HCC) 08/02/2021   Generalized weakness 07/30/2021   Chronic pain 07/30/2021   Hypertension 07/30/2021   Degenerative joint disease    Cervical radiculopathy    Vascular dementia without behavioral disturbance (HCC) 03/11/2019    History reviewed. No pertinent surgical history.  OB History   No obstetric history on file.      Home Medications    Prior to Admission medications   Medication Sig Start Date End Date Taking? Authorizing Provider  acetaminophen (TYLENOL) 650 MG CR tablet Take 1,300 mg by mouth every 8 (eight) hours as needed for pain.    [provider]  amLODipine (NORVASC) 5 MG tablet Take 1 tablet (5 mg total) by mouth daily. 08/01/21   Pennie Banter, DO  APIXABAN Everlene Balls) VTE STARTER PACK (10MG  AND 5MG ) Take as directed on package: start with two-5mg  tablets twice daily for 7  days. On day 8, switch to one-5mg  tablet twice daily. 08/05/21   Danford, Earl Lites, MD  benzonatate (TESSALON) 200 MG capsule Take 1 capsule (200 mg total) by mouth 3 (three) times daily as needed for cough. 07/18/21   Eusebio Friendly B, PA-C  gabapentin (NEURONTIN) 100 MG capsule Take 1 capsule (100 mg total) by mouth 2 (two) times daily. With breakfast and lunch.  Take 2 capsules (200 mg total) at bedtime Patient taking differently: Take 100-200 mg by mouth 2 (two) times daily. With breakfast and lunch.  Take 2 capsules (200 mg total) at bedtime 07/31/21   Pennie Banter, DO  HYDROcodone-acetaminophen (NORCO) 7.5-325 MG tablet Take 1 tablet by mouth every 8 (eight) hours as needed. 06/15/21   [provider]  ipratropium (ATROVENT) 0.06 % nasal spray Place 2 sprays into both nostrils 4 (four) times daily. 07/18/21   Shirlee Latch, PA-C  morphine (MS CONTIN) 15 MG 12 hr tablet Take 15 mg by mouth at bedtime.    [provider]  naloxone [facility]) nasal spray 4 mg/0.1 mL Place 4 mg into the nose once as needed. 02/23/21   [provider]  XTAMPZA ER 9 MG C12A Take 1 capsule by mouth at bedtime. 06/07/21   [provider]    Family History History reviewed. No pertinent family history.  Social History Social History   Tobacco Use  Smoking status: Never   Smokeless tobacco: Never  Vaping Use   Vaping status: Never Used  Substance Use Topics   Alcohol use: No   Drug use: No     Allergies   Patient has no known allergies.   Review of Systems Review of Systems  Constitutional:  Negative for fever.  Musculoskeletal:  Positive for arthralgias and joint swelling.  Skin:  Negative for color change.     Physical Exam Triage Vital Signs ED Triage Vitals  Encounter Vitals Group     BP      Systolic BP Percentile      Diastolic BP Percentile      Pulse      Resp      Temp      Temp src      SpO2      Weight      Height      Head  Circumference      Peak Flow      Pain Score      Pain Loc      Pain Education      Exclude from Growth Chart    No data found.  Updated Vital Signs BP 123/69 (BP Location: Left Arm)   Pulse 86   Temp 98.4 F (36.9 C) (Oral)   Resp 18   SpO2 92%   Visual Acuity Right Eye Distance:   Left Eye Distance:   Bilateral Distance:    Right Eye Near:   Left Eye Near:    Bilateral Near:     Physical Exam Vitals and nursing note reviewed.  Constitutional:      Appearance: Normal appearance. She is not ill-appearing.  HENT:     Head: Normocephalic and atraumatic.  Musculoskeletal:        General: Swelling present. No tenderness, deformity or signs of injury. Normal range of motion.  Skin:    General: Skin is warm and dry.     Capillary Refill: Capillary refill takes less than 2 seconds.     Findings: No bruising or erythema.  Neurological:     General: No focal deficit present.     Mental Status: She is alert and oriented to person, place, and time.      UC Treatments / Results  Labs (all labs ordered are listed, but only abnormal results are displayed) Labs Reviewed - No data to display  EKG   Radiology DG Ankle Complete Left  Result Date: 01/30/2023 CLINICAL DATA:  Pain and swelling of the left ankle EXAM: LEFT ANKLE COMPLETE - 3+ VIEW COMPARISON:  None Available. FINDINGS: Substantial subcutaneous edema along the ankle especially overlying the malleoli, tracking up into the calf and along the dorsal subcutaneous tissues of the ankle. No underlying bony abnormality.  No malalignment. IMPRESSION: 1. Substantial subcutaneous edema along the ankle and distal calf. No underlying bony abnormality. Electronically Signed   By: Gaylyn Rong M.D.   On: 01/30/2023 15:20    Procedures Procedures (including critical care time)  Medications Ordered in UC Medications - No data to display  Initial Impression / Assessment and Plan / UC Course  I have reviewed the triage  vital signs and the nursing notes.  Pertinent labs & imaging results that were available during my care of the patient were reviewed by me and considered in my medical decision making (see chart for details).   Patient is a nontoxic-appearing 89 year old female presenting for evaluation of several weeks worth of pain and swelling  to the left ankle without any attributable injury.  On exam patient does have edema of her bilateral lower extremities with the left being worse than the right.  It is 1+ pitting to approximately mid tibia bilaterally.  Bilateral calf circumference measured and right calf measures 39-1/2 cm and the left calf measures 40-1/2 cm.  No calf tenderness or positive Homans' sign.  The extremities are pink and warm with 2+ DP and PT pulses.  I will order a x-ray of the left ankle to evaluate for any bony abnormality or degenerative disease that could be causing the patient's pain.  Patient has not had any chest pain or shortness of breath and I have a low index of suspicion for blood clot given the lack of calf tenderness and good perfusion.  Left ankle x-ray independently reviewed and evaluated by me.  Impression: There is soft tissue swelling visible on the x-ray but no bony abnormality is appreciated.  To support her joint is maintained.  Radiology read is pending. Radiology impression states there is substantial subcutaneous edema along the ankle and distal calf but no underlying bony abnormality.  I will discharge patient home with a diagnosis of peripheral edema and have her keep her feet elevated is much as possible to help decrease the swelling along with wear compression socks.  She should make an appointment to follow-up with her primary care doctor should the swelling not improve.   Final Clinical Impressions(s) / UC Diagnoses   Final diagnoses:  Pedal edema     Discharge Instructions      Your x-rays did not show any bony abnormality or joint degeneration that  would be causing the pain and swelling.  I do believe that your swelling is a result of poor peripheral circulation.  Keep your legs elevated is much as possible to help reduce swelling.  Also wear compression socks to help squeeze the fluid back into your circulation so that your body can clear.  Make an appointment with your primary care doctor for reevaluation should your swelling continue.  If you develop any chest pain or shortness of breath I recommend she go to the ER for evaluation.     ED Prescriptions   None    I have reviewed the PDMP during this encounter.   Becky Augusta, NP 01/30/23 1528

## 2023-07-23 ENCOUNTER — Emergency Department: Payer: Medicare Other

## 2023-07-23 ENCOUNTER — Emergency Department
Admission: EM | Admit: 2023-07-23 | Discharge: 2023-07-24 | Disposition: A | Discharge status: Home / Self Care | Payer: Medicare Other | Attending: Emergency Medicine | Admitting: Emergency Medicine

## 2023-07-23 ENCOUNTER — Other Ambulatory Visit: Payer: Self-pay

## 2023-07-23 DIAGNOSIS — T17320A Food in larynx causing asphyxiation, initial encounter: Secondary | ICD-10-CM

## 2023-07-23 DIAGNOSIS — R079 Chest pain, unspecified: Secondary | ICD-10-CM | POA: Diagnosis present

## 2023-07-23 DIAGNOSIS — Z66 Do not resuscitate: Secondary | ICD-10-CM | POA: Insufficient documentation

## 2023-07-23 DIAGNOSIS — W44F3XA Food entering into or through a natural orifice, initial encounter: Secondary | ICD-10-CM

## 2023-07-23 DIAGNOSIS — X58XXXA Exposure to other specified factors, initial encounter: Secondary | ICD-10-CM | POA: Diagnosis not present

## 2023-07-23 DIAGNOSIS — F0390 Unspecified dementia without behavioral disturbance: Secondary | ICD-10-CM | POA: Diagnosis not present

## 2023-07-23 DIAGNOSIS — G8929 Other chronic pain: Secondary | ICD-10-CM

## 2023-07-23 DIAGNOSIS — T17920A Food in respiratory tract, part unspecified causing asphyxiation, initial encounter: Secondary | ICD-10-CM | POA: Diagnosis not present

## 2023-07-23 DIAGNOSIS — R0789 Other chest pain: Secondary | ICD-10-CM | POA: Diagnosis not present

## 2023-07-23 DIAGNOSIS — M25512 Pain in left shoulder: Secondary | ICD-10-CM | POA: Diagnosis not present

## 2023-07-23 LAB — CBC
HCT: 38.1 % (ref 36.0–46.0)
Hemoglobin: 12.7 g/dL (ref 12.0–15.0)
MCH: 31.8 pg (ref 26.0–34.0)
MCHC: 33.3 g/dL (ref 30.0–36.0)
MCV: 95.3 fL (ref 80.0–100.0)
Platelets: 288 10*3/uL (ref 150–400)
RBC: 4 MIL/uL (ref 3.87–5.11)
RDW: 11.3 % — ABNORMAL LOW (ref 11.5–15.5)
WBC: 7.4 10*3/uL (ref 4.0–10.5)
nRBC: 0 % (ref 0.0–0.2)

## 2023-07-23 LAB — BASIC METABOLIC PANEL
Anion gap: 11 (ref 5–15)
BUN: 17 mg/dL (ref 8–23)
CO2: 24 mmol/L (ref 22–32)
Calcium: 9.2 mg/dL (ref 8.9–10.3)
Chloride: 95 mmol/L — ABNORMAL LOW (ref 98–111)
Creatinine, Ser: 0.78 mg/dL (ref 0.44–1.00)
GFR, Estimated: >60 mL/min (ref >60)
Glucose, Bld: 161 mg/dL — ABNORMAL HIGH (ref 70–99)
Potassium: 4.1 mmol/L (ref 3.5–5.1)
Sodium: 130 mmol/L — ABNORMAL LOW (ref 135–145)

## 2023-07-23 LAB — TROPONIN I (HIGH SENSITIVITY): Troponin I (High Sensitivity): 7 ng/L (ref <18)

## 2023-07-23 NOTE — ED Provider Notes (Signed)
 [facility] Provider Note    Event Date/Time   First MD Initiated Contact with Patient 07/23/23 2306     (approximate)   History   Shoulder Pain (L) and Chest Pain   HPI  Hannah Dillon is a 89 y.o. female   Past medical history of cervical radiculopathy, degenerative joint disease, chronic left shoulder pain, PE no longer on anticoagulation, who presents to the emergency department with left shoulder pain.  This was exacerbated while eating today when she perhaps gagged on some food, was coughing which exacerbated her left shoulder pain.  She is complaining of chest pain at the time as well.  She has dementia and so her son is here to give collateral information.  Currently she feels well.  She denies any chest pain or shoulder pain currently.  She denies shortness of breath.  There has been no cough.  Independent Historian contributed to assessment above: Her son is at the bedside, Vinie, who gives collateral information as above.  He confirms that she is a DNR and would like to avoid major surgeries if indicated.     Physical Exam   Triage Vital Signs: ED Triage Vitals [07/23/23 1927]  Encounter Vitals Group     BP (!) 172/78     Systolic BP Percentile      Diastolic BP Percentile      Pulse Rate 89     Resp 16     Temp 98.1 F (36.7 C)     Temp Source Oral     SpO2 94 %     Weight 130 lb (59 kg)     Height 5' (1.524 m)     Head Circumference      Peak Flow      Pain Score      Pain Loc      Pain Education      Exclude from Growth Chart     Most recent vital signs: Vitals:   07/23/23 1927  BP: (!) 172/78  Pulse: 89  Resp: 16  Temp: 98.1 F (36.7 C)  SpO2: 94%    General: Awake, no distress.  CV:  Good peripheral perfusion.  Resp:  Normal effort.  Abd:  No distention.  Other:  Awake alert comfortable appearing.  Soft benign abdominal exam to deep palpation all quadrants.  Radial pulses intact and equal bilaterally.  Clear  lungs to auscultation without focality or wheezing.  Breathing comfortably.  No chest wall tenderness to palpation.   ED Results / Procedures / Treatments   Labs (all labs ordered are listed, but only abnormal results are displayed) Labs Reviewed  BASIC METABOLIC PANEL - Abnormal; Notable for the following components:      Result Value   Sodium 130 (*)    Chloride 95 (*)    Glucose, Bld 161 (*)    All other components within normal limits  CBC - Abnormal; Notable for the following components:   RDW 11.3 (*)    All other components within normal limits  TROPONIN I (HIGH SENSITIVITY)  TROPONIN I (HIGH SENSITIVITY)     I ordered and reviewed the above labs they are notable for cell counts and electrolytes, initial troponin within normal limit.  EKG  ED ECG REPORT I, Ginnie Shams, the attending physician, personally viewed and interpreted this ECG.   Date: 07/23/2023  EKG Time: 1928  Rate: 85  Rhythm: sinus  Axis: nl  Intervals:none  ST&T Change: no stemi  RADIOLOGY I independently reviewed and interpreted chest x-ray and I see no obvious focality or pneumothorax I also reviewed radiologist's formal read.   PROCEDURES:  Critical Care performed: No  Procedures   MEDICATIONS ORDERED IN ED: Medications - No data to display  IMPRESSION / MDM / ASSESSMENT AND PLAN / ED COURSE  I reviewed the triage vital signs and the nursing notes.                                Patient's presentation is most consistent with acute presentation with potential threat to life or bodily function.  Differential diagnosis includes, but is not limited to, ACS, PE, dissection, exacerbation of left chronic shoulder pain and cervical radiculopathy, aspiration, pneumonia, aspiration pneumonitis   The patient is on the cardiac monitor to evaluate for evidence of arrhythmia and/or significant heart rate changes.  MDM:    This is a patient who had a near aspiration event that  exacerbated her chronic left shoulder pain.  She is currently asymptomatic.  In terms of her aspiration event, there is no evidence of focality on chest x-ray to suggest a pneumonia, she has no shortness of breath, no increased work of breathing, and clear lungs to auscultation with no hypoxemia.  I advised her son to keep a close eye on her respiratory status should she develop any signs of infection to return or see doctor reassessment.   Regarding her chest pain and shoulder pain I think this was exacerbated by her event.  She does have a history of PE no longer on anticoagulation but without persistent chest pain and no shortness of breath I think this is a much less likely diagnosis at this time.  Same goes with ACS and dissection.  However given her dementia, prudent to check EKG and serial troponins in the setting of transient chest pain/perhaps radiation to the left arm.  If workup unremarkable patient remains asymptomatic plan will be for discharge home with PMD follow-up.       FINAL CLINICAL IMPRESSION(S) / ED DIAGNOSES   Final diagnoses:  Choking due to food (regurgitated), initial encounter  Chronic left shoulder pain  Atypical chest pain     Rx / DC Orders   ED Discharge Orders     None        Note:  This document was prepared using Dragon voice recognition software and may include unintentional dictation errors.    Cyrena Mylar, MD 07/24/23 [phone]

## 2023-07-23 NOTE — ED Triage Notes (Signed)
 [name] from home d/t concerns for sudden dull left shoulder pain while sitting down having dinner. Pain radiated into the left jaw and chest. Pt has hx of pinched never to the left arm, sts this pain does not feel the same. No known cardiac hx.

## 2023-07-24 LAB — TROPONIN I (HIGH SENSITIVITY): Troponin I (High Sensitivity): 7 ng/L (ref <18)

## 2023-07-24 NOTE — Discharge Instructions (Addendum)
 Thank you for choosing [place] for your health care today!  Please see your primary doctor this week for a follow up appointment.   If you have any new, worsening, or unexpected symptoms call your doctor right away or come back to the emergency department for reevaluation.  It was my pleasure to care for you today.   Daneil Dan [place] Charon, MD

## 2023-10-03 ENCOUNTER — Emergency Department

## 2023-10-03 ENCOUNTER — Other Ambulatory Visit: Payer: Self-pay

## 2023-10-03 ENCOUNTER — Encounter: Payer: Self-pay | Admitting: *Deleted

## 2023-10-03 ENCOUNTER — Inpatient Hospital Stay
Admission: EM | Admit: 2023-10-03 | Discharge: 2023-10-08 | DRG: 086 | Disposition: A | Discharge status: Hospice | Attending: Internal Medicine | Admitting: Internal Medicine

## 2023-10-03 DIAGNOSIS — Z8616 Personal history of COVID-19: Secondary | ICD-10-CM

## 2023-10-03 DIAGNOSIS — K567 Ileus, unspecified: Secondary | ICD-10-CM | POA: Diagnosis present

## 2023-10-03 DIAGNOSIS — Z8249 Family history of ischemic heart disease and other diseases of the circulatory system: Secondary | ICD-10-CM

## 2023-10-03 DIAGNOSIS — Z515 Encounter for palliative care: Secondary | ICD-10-CM

## 2023-10-03 DIAGNOSIS — M25512 Pain in left shoulder: Secondary | ICD-10-CM | POA: Diagnosis present

## 2023-10-03 DIAGNOSIS — Y92008 Other place in unspecified non-institutional (private) residence as the place of occurrence of the external cause: Secondary | ICD-10-CM

## 2023-10-03 DIAGNOSIS — R4182 Altered mental status, unspecified: Secondary | ICD-10-CM | POA: Diagnosis not present

## 2023-10-03 DIAGNOSIS — Z79899 Other long term (current) drug therapy: Secondary | ICD-10-CM

## 2023-10-03 DIAGNOSIS — Z86711 Personal history of pulmonary embolism: Secondary | ICD-10-CM

## 2023-10-03 DIAGNOSIS — R5383 Other fatigue: Secondary | ICD-10-CM

## 2023-10-03 DIAGNOSIS — Z66 Do not resuscitate: Secondary | ICD-10-CM | POA: Diagnosis present

## 2023-10-03 DIAGNOSIS — G629 Polyneuropathy, unspecified: Secondary | ICD-10-CM | POA: Diagnosis present

## 2023-10-03 DIAGNOSIS — R296 Repeated falls: Secondary | ICD-10-CM | POA: Diagnosis present

## 2023-10-03 DIAGNOSIS — E878 Other disorders of electrolyte and fluid balance, not elsewhere classified: Secondary | ICD-10-CM | POA: Diagnosis present

## 2023-10-03 DIAGNOSIS — Z79891 Long term (current) use of opiate analgesic: Secondary | ICD-10-CM

## 2023-10-03 DIAGNOSIS — E871 Hypo-osmolality and hyponatremia: Secondary | ICD-10-CM | POA: Diagnosis present

## 2023-10-03 DIAGNOSIS — M25511 Pain in right shoulder: Secondary | ICD-10-CM | POA: Diagnosis present

## 2023-10-03 DIAGNOSIS — F0150 Vascular dementia without behavioral disturbance: Secondary | ICD-10-CM | POA: Diagnosis present

## 2023-10-03 DIAGNOSIS — M5412 Radiculopathy, cervical region: Secondary | ICD-10-CM | POA: Diagnosis present

## 2023-10-03 DIAGNOSIS — W1830XA Fall on same level, unspecified, initial encounter: Secondary | ICD-10-CM | POA: Diagnosis present

## 2023-10-03 DIAGNOSIS — I10 Essential (primary) hypertension: Secondary | ICD-10-CM | POA: Diagnosis present

## 2023-10-03 DIAGNOSIS — D7589 Other specified diseases of blood and blood-forming organs: Secondary | ICD-10-CM | POA: Diagnosis present

## 2023-10-03 DIAGNOSIS — S065X0A Traumatic subdural hemorrhage without loss of consciousness, initial encounter: Secondary | ICD-10-CM | POA: Diagnosis not present

## 2023-10-03 DIAGNOSIS — S065XAA Traumatic subdural hemorrhage with loss of consciousness status unknown, initial encounter: Principal | ICD-10-CM | POA: Diagnosis present

## 2023-10-03 DIAGNOSIS — I160 Hypertensive urgency: Secondary | ICD-10-CM | POA: Diagnosis present

## 2023-10-03 DIAGNOSIS — E872 Acidosis, unspecified: Secondary | ICD-10-CM | POA: Diagnosis present

## 2023-10-03 DIAGNOSIS — W19XXXA Unspecified fall, initial encounter: Secondary | ICD-10-CM

## 2023-10-03 DIAGNOSIS — G894 Chronic pain syndrome: Secondary | ICD-10-CM | POA: Diagnosis present

## 2023-10-03 DIAGNOSIS — Z9181 History of falling: Secondary | ICD-10-CM

## 2023-10-03 LAB — CBC
HCT: 40.9 % (ref 36.0–46.0)
Hemoglobin: 13.1 g/dL (ref 12.0–15.0)
MCH: 33 pg (ref 26.0–34.0)
MCHC: 32 g/dL (ref 30.0–36.0)
MCV: 103 fL — ABNORMAL HIGH (ref 80.0–100.0)
Platelets: 249 10*3/uL (ref 150–400)
RBC: 3.97 MIL/uL (ref 3.87–5.11)
RDW: 11.8 % (ref 11.5–15.5)
WBC: 9.6 10*3/uL (ref 4.0–10.5)
nRBC: 0 % (ref 0.0–0.2)

## 2023-10-03 LAB — TROPONIN I (HIGH SENSITIVITY)
Troponin I (High Sensitivity): 8 ng/L (ref <18)
Troponin I (High Sensitivity): 8 ng/L (ref <18)

## 2023-10-03 MED ORDER — AMLODIPINE BESYLATE 5 MG PO TABS
5.0000 mg | ORAL_TABLET | Freq: Once | ORAL | Status: AC
  Administered 2023-10-03: 5 mg via ORAL
  Filled 2023-10-03: qty 1

## 2023-10-03 NOTE — ED Provider Notes (Signed)
 [facility] Provider Note    Event Date/Time   First MD Initiated Contact with Patient 10/03/23 2306     (approximate)   History   Hypertension   HPI  Hannah Dillon is a 89 y.o. female with history of hypertension, chronic pain, possible dementia not formally diagnosed who presents to the emergency department after a fall that occurred 2 days ago.  Most of the history is obtained from her son.  He reports that she slipped in the bathtub and hit her head.  There was no loss of consciousness.  She was previously on Eliquis years ago for history of PE but is not on this medication any longer.  He states over the past couple days he was concerned because she seemed to be more "lethargic", sleeping more than normal, not eating and drinking.  He is worried that she is unsteady on her feet.  She does have a walker at home.  Patient denies any acute pain at this time.  Send denies any fevers, cough, vomiting or diarrhea.   History provided by patient's son, patient.    Past Medical History:  Diagnosis Date   Arthritis    Cervical radiculopathy    COVID-19 virus infection 07/27/2021   DJD (degenerative joint disease)    Left arm pain     History reviewed. No pertinent surgical history.  MEDICATIONS:  Prior to Admission medications   Medication Sig Start Date End Date Taking? Authorizing Provider  acetaminophen (TYLENOL) 650 MG CR tablet Take 1,300 mg by mouth every 8 (eight) hours as needed for pain.    [provider]  amLODipine (NORVASC) 5 MG tablet Take 1 tablet (5 mg total) by mouth daily. 08/01/21   Pennie Banter, DO  APIXABAN Everlene Balls) VTE STARTER PACK (10MG  AND 5MG ) Take as directed on package: start with two-5mg  tablets twice daily for 7 days. On day 8, switch to one-5mg  tablet twice daily. 08/05/21   Danford, Earl Lites, MD  benzonatate (TESSALON) 200 MG capsule Take 1 capsule (200 mg total) by mouth 3 (three) times daily as needed for  cough. 07/18/21   Eusebio Friendly B, PA-C  gabapentin (NEURONTIN) 100 MG capsule Take 1 capsule (100 mg total) by mouth 2 (two) times daily. With breakfast and lunch.  Take 2 capsules (200 mg total) at bedtime Patient taking differently: Take 100-200 mg by mouth 2 (two) times daily. With breakfast and lunch.  Take 2 capsules (200 mg total) at bedtime 07/31/21   Pennie Banter, DO  HYDROcodone-acetaminophen (NORCO) 7.5-325 MG tablet Take 1 tablet by mouth every 8 (eight) hours as needed. 06/15/21   [provider]  ipratropium (ATROVENT) 0.06 % nasal spray Place 2 sprays into both nostrils 4 (four) times daily. 07/18/21   Shirlee Latch, PA-C  morphine (MS CONTIN) 15 MG 12 hr tablet Take 15 mg by mouth at bedtime.    [provider]  naloxone [facility]) nasal spray 4 mg/0.1 mL Place 4 mg into the nose once as needed. 02/23/21   [provider]  XTAMPZA ER 9 MG C12A Take 1 capsule by mouth at bedtime. 06/07/21   [provider]    Physical Exam   Triage Vital Signs: ED Triage Vitals  Encounter Vitals Group     BP 10/03/23 1931 (!) 204/86     Systolic BP Percentile --      Diastolic BP Percentile --      Pulse Rate 10/03/23 1931 73  Resp 10/03/23 1931 18     Temp 10/03/23 1931 98.4 F (36.9 C)     Temp Source 10/03/23 1931 Oral     SpO2 10/03/23 1931 95 %     Weight 10/03/23 1932 130 lb 1.1 oz (59 kg)     Height 10/03/23 1932 5' (1.524 m)     Head Circumference --      Peak Flow --      Pain Score --      Pain Loc --      Pain Education --      Exclude from Growth Chart --     Most recent vital signs: Vitals:   10/04/23 0445 10/04/23 0545  BP: (!) 150/78 (!) 159/72  Pulse: 74 69  Resp: 18 15  Temp:    SpO2: 99% 100%     CONSTITUTIONAL: Alert, responds appropriately to some questions, appears confused HEAD: Normocephalic; bruising noted to multiple places on her face EYES: Conjunctivae clear, PERRL, EOMI ENT: normal nose; no rhinorrhea;  moist mucous membranes; pharynx without lesions noted; no dental injury; no septal hematoma, no epistaxis; no facial deformity or bony tenderness NECK: Supple, no midline spinal tenderness, step-off or deformity; trachea midline CARD: RRR; S1 and S2 appreciated; no murmurs, no clicks, no rubs, no gallops RESP: Normal chest excursion without splinting or tachypnea; breath sounds clear and equal bilaterally; no wheezes, no rhonchi, no rales; no hypoxia or respiratory distress CHEST:  chest wall stable, no crepitus or ecchymosis or deformity, nontender to palpation; no flail chest ABD/GI: Non-distended; soft, non-tender, no rebound, no guarding; no ecchymosis or other lesions noted PELVIS:  stable, nontender to palpation BACK:  The back appears normal; no midline spinal tenderness, step-off or deformity EXT: Normal ROM in all joints; no edema; normal capillary refill; no cyanosis, no bony tenderness or bony deformity of patient's extremities, no joint effusions, compartments are soft, extremities are warm and well-perfused, no ecchymosis SKIN: Normal color for age and race; warm NEURO: No facial asymmetry, normal speech, moving all extremities equally, unable to reliably test sensation  ED Results / Procedures / Treatments   LABS: (all labs ordered are listed, but only abnormal results are displayed) Labs Reviewed  URINALYSIS, ROUTINE W REFLEX MICROSCOPIC - Abnormal; Notable for the following components:      Result Value   Color, Urine YELLOW (*)    APPearance CLOUDY (*)    Hgb urine dipstick SMALL (*)    Ketones, ur 5 (*)    Protein, ur 100 (*)    Bacteria, UA MANY (*)    All other components within normal limits  CBC - Abnormal; Notable for the following components:   MCV 103.0 (*)    All other components within normal limits  BASIC METABOLIC PANEL - Abnormal; Notable for the following components:   Sodium 129 (*)    Chloride 95 (*)    CO2 18 (*)    Glucose, Bld 175 (*)    Anion gap  16 (*)    All other components within normal limits  TSH  TROPONIN I (HIGH SENSITIVITY)  TROPONIN I (HIGH SENSITIVITY)     EKG:  EKG Interpretation Date/Time:  Thursday October 03 2023 19:37:42 EDT Ventricular Rate:  72 PR Interval:  196 QRS Duration:  84 QT Interval:  358 QTC Calculation: 392 R Axis:   -35  Text Interpretation: Normal sinus rhythm Left axis deviation Abnormal ECG When compared with ECG of 23-Jul-2023 19:28, Premature ventricular complexes are no longer Present  Nonspecific T wave abnormality has replaced inverted T waves in Lateral leads Confirmed by Rochele Raring [phone]) on 10/04/2023 12:26:07 AM          RADIOLOGY: My personal review and interpretation of imaging: CT head shows bilateral chronic subdural hematomas with acute right subdural hematoma without shift.  Chest x-ray clear.  I have personally reviewed all radiology reports. CT HEAD WO CONTRAST ( ) Result Date: 10/04/2023 CLINICAL DATA:  Stand for follow-up subdural hematomas. EXAM: CT HEAD WITHOUT CONTRAST TECHNIQUE: Contiguous axial images were obtained from the base of the skull through the vertex without intravenous contrast. RADIATION DOSE REDUCTION: This exam was performed according to the departmental dose-optimization program which includes automated exposure control, adjustment of the mA and/or kV according to patient size and/or use of iterative reconstruction technique. COMPARISON:  October 03, 2023 FINDINGS: Brain: There is generalized cerebral atrophy with widening of the extra-axial spaces and ventricular dilatation. There are areas of decreased attenuation within the white matter tracts of the supratentorial brain, consistent with microvascular disease changes. Stable, chronic, approximately 6 mm thick bilateral subdural hematomas versus subdural hygromas are seen. A small amount of superimposed acute blood is noted within the frontotemporal region on the right. This is stable in appearance when  compared to the prior exam. Stable subcentimeter foci of increased attenuation are also again seen within the left frontal lobe (axial CT images 18 and 20, CT series 2/sagittal reformatted image 30, CT series 5). There is no evidence of mass effect or midline shift. Vascular: Moderate to marked severity bilateral cavernous carotid artery calcification is noted. Skull: Normal. Negative for fracture or focal lesion. Sinuses/Orbits: No acute finding. Other: None. IMPRESSION: 1. Stable, chronic, approximately 6 mm thick bilateral subdural hematomas versus subdural hygromas. 2. Small amount of superimposed acute blood within the frontotemporal region on the right, stable in appearance when compared to the prior exam. 3. Stable subcentimeter foci of increased attenuation within the left frontal lobe, which may represent small foci of acute hemorrhage. MRI correlation is recommended. 4. Generalized cerebral atrophy and microvascular disease changes of the supratentorial brain. Electronically Signed   By: Aram Candela M.D.   On: 10/04/2023 02:39   CT Maxillofacial Wo Contrast Result Date: 10/04/2023 CLINICAL DATA:  Fall, bruising to left eye and face. EXAM: CT MAXILLOFACIAL WITHOUT CONTRAST TECHNIQUE: Multidetector CT imaging of the maxillofacial structures was performed. Multiplanar CT image reconstructions were also generated. RADIATION DOSE REDUCTION: This exam was performed according to the departmental dose-optimization program which includes automated exposure control, adjustment of the mA and/or kV according to patient size and/or use of iterative reconstruction technique. COMPARISON:  None Available. FINDINGS: Osseous: No fracture or mandibular dislocation. No destructive process. Orbits: Negative. No traumatic or inflammatory finding. Sinuses: Clear Soft tissues: Negative Limited intracranial: See head CT report IMPRESSION: No facial or orbital fracture. Electronically Signed   By: Charlett Nose M.D.   On:  10/04/2023 00:16   CT Cervical Spine Wo Contrast Result Date: 10/04/2023 CLINICAL DATA:  Neck trauma (Age >= 65y). Fall 2 days ago. Bruising to left eye and face. EXAM: CT CERVICAL SPINE WITHOUT CONTRAST TECHNIQUE: Multidetector CT imaging of the cervical spine was performed without intravenous contrast. Multiplanar CT image reconstructions were also generated. RADIATION DOSE REDUCTION: This exam was performed according to the departmental dose-optimization program which includes automated exposure control, adjustment of the mA and/or kV according to patient size and/or use of iterative reconstruction technique. COMPARISON:  None Available. FINDINGS: Alignment: Normal Skull base and vertebrae:  No acute fracture. No primary bone lesion or focal pathologic process. Soft tissues and spinal canal: No prevertebral fluid or swelling. No visible canal hematoma. Disc levels:  Diffuse degenerative disc disease and facet disease. Upper chest: No acute findings Other: None IMPRESSION: Degenerative disc and facet disease.  No acute bony abnormality. Electronically Signed   By: Charlett Nose M.D.   On: 10/04/2023 00:14   DG Chest 2 View Result Date: 10/03/2023 CLINICAL DATA:  Weakness EXAM: CHEST - 2 VIEW COMPARISON:  07/23/2023 FINDINGS: Heart mediastinal contours within normal limits. Minimal bibasilar linear opacities, scarring versus atelectasis. No effusions. No acute bony abnormality. IMPRESSION: Bibasilar scarring or atelectasis. No active disease. Electronically Signed   By: Charlett Nose M.D.   On: 10/03/2023 20:28   CT Head Wo Contrast Result Date: 10/03/2023 CLINICAL DATA:  Head trauma, minor (Age >= 65y).  Fall 2 days ago. EXAM: CT HEAD WITHOUT CONTRAST TECHNIQUE: Contiguous axial images were obtained from the base of the skull through the vertex without intravenous contrast. RADIATION DOSE REDUCTION: This exam was performed according to the departmental dose-optimization program which includes automated  exposure control, adjustment of the mA and/or kV according to patient size and/or use of iterative reconstruction technique. COMPARISON:  10/10/2014 FINDINGS: Brain: There are low-density subdural fluid collections bilaterally compatible with subdural hygromas or chronic subdural hematomas, 6 mm bilaterally. Small amount of high density subdural blood noted over the right frontotemporal region compatible with an acute component. No intraparenchymal hemorrhage. No hydrocephalus. No midline shift. Vascular: No hyperdense vessel or unexpected calcification. Skull: No acute calvarial abnormality. 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 A superimposed acute component of the right subdural hematoma noted. These results were called by telephone at the time of interpretation on 10/03/2023 at 8:09 pm to provider [facility] , who verbally acknowledged these results. Electronically Signed   By: Charlett Nose M.D.   On: 10/03/2023 20:11     PROCEDURES:  Critical Care performed: Yes, see critical care procedure note(s)   CRITICAL CARE Performed by: Baxter Hire Mc Bloodworth   Total critical care time: 35 minutes  Critical care time was exclusive of separately billable procedures and treating other patients.  Critical care was necessary to treat or prevent imminent or life-threatening deterioration.  Critical care was time spent personally by me on the following activities: development of treatment plan with patient and/or surrogate as well as nursing, discussions with consultants, evaluation of patient's response to treatment, examination of patient, obtaining history from patient or surrogate, ordering and performing treatments and interventions, ordering and review of laboratory studies, ordering and review of radiographic studies, pulse oximetry and re-evaluation of patient's condition.   Marland Kitchen1-3 Lead EKG Interpretation  Performed  by: Destane Speas, Layla Maw, DO Authorized by: Rowland Ericsson, Layla Maw, DO     Interpretation: normal     ECG rate:  69   ECG rate assessment: normal     Rhythm: sinus rhythm     Ectopy: none     Conduction: normal       IMPRESSION / MDM / ASSESSMENT AND PLAN / ED COURSE  I reviewed the triage vital signs and the nursing notes.  Patient here with fall 2 days ago.  Family reports she has been more "lethargic" than normal.  The patient is on the cardiac monitor to evaluate for evidence of arrhythmia and/or significant heart rate changes.   DIFFERENTIAL DIAGNOSIS (includes but not limited to):   Skull fracture,  cervical spine fracture, intracranial hemorrhage, anemia, electrolyte derangement, UTI, dehydration  Patient's presentation is most consistent with acute presentation with potential threat to life or bodily function.  PLAN: Workup initiated from triage.  CT of the head shows bilateral chronic subdural hematomas with a small amount of superimposed acute blood within the right frontotemporal region.  No midline shift.  Dr. Marcell Barlow with neurosurgery aware and recommends repeat imaging in 6 hours.  She is not on anticoagulation.  She is moving all of her extremities and has normal speech.  Will obtain labs, urine given family's concern for worsening mental status, lethargy, unsteady gait.   MEDICATIONS GIVEN IN ED: Medications  HYDROcodone-acetaminophen (NORCO) 7.5-325 MG per tablet 1 tablet (has no administration in time range)  morphine (MS CONTIN) 12 hr tablet 15 mg (15 mg Oral Given 10/04/23 0320)  amLODipine (NORVASC) tablet 5 mg (has no administration in time range)  naloxone (NARCAN) nasal spray 4 mg/0.1 mL (has no administration in time range)  gabapentin (NEURONTIN) capsule 300 mg (300 mg Oral Given 10/04/23 0321)   stroke: early stages of recovery book (has no administration in time range)  acetaminophen (TYLENOL) tablet 650 mg (650 mg Oral Given 10/04/23 0320)    Or  acetaminophen  (TYLENOL) 160 MG/5ML solution 650 mg ( Per Tube See Alternative 10/04/23 0320)    Or  acetaminophen (TYLENOL) suppository 650 mg ( Rectal See Alternative 10/04/23 0320)  senna-docusate (Senokot-S) tablet 1 tablet (has no administration in time range)  pantoprazole (PROTONIX) injection 40 mg (40 mg Intravenous Given 10/04/23 0210)  ondansetron (ZOFRAN) injection 4 mg (has no administration in time range)  traZODone (DESYREL) tablet 25 mg (has no administration in time range)  hydrALAZINE (APRESOLINE) injection 10 mg (10 mg Intravenous Given 10/04/23 0328)  amLODipine (NORVASC) tablet 5 mg (5 mg Oral Given 10/03/23 2346)  labetalol (NORMODYNE) injection 20 mg (20 mg Intravenous Given 10/04/23 0206)     ED COURSE: Patient's labs show sodium of 129.  No UTI.  Mild metabolic acidosis with blood glucose of 175.  Doubt that this is DKA.  Urine shows only 5 ketones.  No sign of UTI.  Blood pressures have been elevated but improved with oral amlodipine.  Will discuss with hospitalist for admission.  Son confirms patient is a DNR/DNI.  CONSULTS:  Consulted and discussed patient's case with hospitalist, Dr. Arville Care.  I have recommended admission and consulting physician agrees and will place admission orders.  Patient (and family if present) agree with this plan.   I reviewed all nursing notes, vitals, pertinent previous records.  All labs, EKGs, imaging ordered have been independently reviewed and interpreted by myself.    OUTSIDE RECORDS REVIEWED: Reviewed patient's previous pain management note on 08/30/2023.       FINAL CLINICAL IMPRESSION(S) / ED DIAGNOSES   Final diagnoses:  Subdural hematoma (HCC)  Fall, initial encounter  Lethargic  Metabolic acidosis     Rx / DC Orders   ED Discharge Orders     None        Note:  This document was prepared using Dragon voice recognition software and may include unintentional dictation errors.   Jabaree Mercado, Layla Maw, DO 10/04/23 [phone]

## 2023-10-03 NOTE — ED Triage Notes (Signed)
 Pt to triage via wheelchair.  Pt is from home.  Son reports pt has high blood pressure and lethargy today.  Pt fell 2 days ago  pt has bruising to left eye/face.   No blood thinners.  Pt alert.

## 2023-10-04 ENCOUNTER — Telehealth: Payer: Self-pay | Admitting: Neurosurgery

## 2023-10-04 ENCOUNTER — Inpatient Hospital Stay

## 2023-10-04 DIAGNOSIS — R5383 Other fatigue: Secondary | ICD-10-CM | POA: Diagnosis not present

## 2023-10-04 DIAGNOSIS — I160 Hypertensive urgency: Secondary | ICD-10-CM | POA: Diagnosis present

## 2023-10-04 DIAGNOSIS — E878 Other disorders of electrolyte and fluid balance, not elsewhere classified: Secondary | ICD-10-CM | POA: Diagnosis present

## 2023-10-04 DIAGNOSIS — S065XAA Traumatic subdural hemorrhage with loss of consciousness status unknown, initial encounter: Principal | ICD-10-CM

## 2023-10-04 DIAGNOSIS — Z66 Do not resuscitate: Secondary | ICD-10-CM | POA: Diagnosis present

## 2023-10-04 DIAGNOSIS — Z8249 Family history of ischemic heart disease and other diseases of the circulatory system: Secondary | ICD-10-CM | POA: Diagnosis not present

## 2023-10-04 DIAGNOSIS — Z79891 Long term (current) use of opiate analgesic: Secondary | ICD-10-CM | POA: Diagnosis not present

## 2023-10-04 DIAGNOSIS — Z8616 Personal history of COVID-19: Secondary | ICD-10-CM | POA: Diagnosis not present

## 2023-10-04 DIAGNOSIS — G629 Polyneuropathy, unspecified: Secondary | ICD-10-CM | POA: Diagnosis present

## 2023-10-04 DIAGNOSIS — M25511 Pain in right shoulder: Secondary | ICD-10-CM | POA: Diagnosis present

## 2023-10-04 DIAGNOSIS — E872 Acidosis, unspecified: Secondary | ICD-10-CM | POA: Diagnosis present

## 2023-10-04 DIAGNOSIS — W1830XA Fall on same level, unspecified, initial encounter: Secondary | ICD-10-CM | POA: Diagnosis present

## 2023-10-04 DIAGNOSIS — Z86711 Personal history of pulmonary embolism: Secondary | ICD-10-CM

## 2023-10-04 DIAGNOSIS — R296 Repeated falls: Secondary | ICD-10-CM | POA: Diagnosis present

## 2023-10-04 DIAGNOSIS — M25512 Pain in left shoulder: Secondary | ICD-10-CM | POA: Diagnosis present

## 2023-10-04 DIAGNOSIS — Y92008 Other place in unspecified non-institutional (private) residence as the place of occurrence of the external cause: Secondary | ICD-10-CM | POA: Diagnosis not present

## 2023-10-04 DIAGNOSIS — Z9181 History of falling: Secondary | ICD-10-CM | POA: Diagnosis not present

## 2023-10-04 DIAGNOSIS — D7589 Other specified diseases of blood and blood-forming organs: Secondary | ICD-10-CM | POA: Diagnosis present

## 2023-10-04 DIAGNOSIS — W19XXXA Unspecified fall, initial encounter: Secondary | ICD-10-CM

## 2023-10-04 DIAGNOSIS — K567 Ileus, unspecified: Secondary | ICD-10-CM | POA: Diagnosis present

## 2023-10-04 DIAGNOSIS — Z7189 Other specified counseling: Secondary | ICD-10-CM

## 2023-10-04 DIAGNOSIS — Z515 Encounter for palliative care: Secondary | ICD-10-CM | POA: Diagnosis not present

## 2023-10-04 DIAGNOSIS — I10 Essential (primary) hypertension: Secondary | ICD-10-CM | POA: Diagnosis present

## 2023-10-04 DIAGNOSIS — R4182 Altered mental status, unspecified: Secondary | ICD-10-CM | POA: Diagnosis present

## 2023-10-04 DIAGNOSIS — G894 Chronic pain syndrome: Secondary | ICD-10-CM | POA: Diagnosis present

## 2023-10-04 DIAGNOSIS — Z79899 Other long term (current) drug therapy: Secondary | ICD-10-CM | POA: Diagnosis not present

## 2023-10-04 DIAGNOSIS — G6289 Other specified polyneuropathies: Secondary | ICD-10-CM | POA: Diagnosis not present

## 2023-10-04 DIAGNOSIS — E871 Hypo-osmolality and hyponatremia: Secondary | ICD-10-CM | POA: Diagnosis present

## 2023-10-04 DIAGNOSIS — S065X0A Traumatic subdural hemorrhage without loss of consciousness, initial encounter: Secondary | ICD-10-CM | POA: Diagnosis present

## 2023-10-04 DIAGNOSIS — M5412 Radiculopathy, cervical region: Secondary | ICD-10-CM | POA: Diagnosis present

## 2023-10-04 DIAGNOSIS — F0150 Vascular dementia without behavioral disturbance: Secondary | ICD-10-CM | POA: Diagnosis present

## 2023-10-04 LAB — BASIC METABOLIC PANEL
Anion gap: 16 — ABNORMAL HIGH (ref 5–15)
Anion gap: 10 (ref 5–15)
BUN: 22 mg/dL (ref 8–23)
BUN: 30 mg/dL — ABNORMAL HIGH (ref 8–23)
CO2: 18 mmol/L — ABNORMAL LOW (ref 22–32)
CO2: 26 mmol/L (ref 22–32)
Calcium: 9 mg/dL (ref 8.9–10.3)
Calcium: 9.1 mg/dL (ref 8.9–10.3)
Chloride: 95 mmol/L — ABNORMAL LOW (ref 98–111)
Chloride: 90 mmol/L — ABNORMAL LOW (ref 98–111)
Creatinine, Ser: 0.63 mg/dL (ref 0.44–1.00)
Creatinine, Ser: 0.8 mg/dL (ref 0.44–1.00)
GFR, Estimated: >60 mL/min (ref >60)
GFR, Estimated: >60 mL/min (ref >60)
Glucose, Bld: 175 mg/dL — ABNORMAL HIGH (ref 70–99)
Glucose, Bld: 226 mg/dL — ABNORMAL HIGH (ref 70–99)
Potassium: 4.7 mmol/L (ref 3.5–5.1)
Potassium: 3.9 mmol/L (ref 3.5–5.1)
Sodium: 129 mmol/L — ABNORMAL LOW (ref 135–145)
Sodium: 126 mmol/L — ABNORMAL LOW (ref 135–145)

## 2023-10-04 LAB — URINALYSIS, ROUTINE W REFLEX MICROSCOPIC
Bilirubin Urine: NEGATIVE
Glucose, UA: NEGATIVE mg/dL
Ketones, ur: 5 mg/dL — AB
Leukocytes,Ua: NEGATIVE
Nitrite: NEGATIVE
Protein, ur: 100 mg/dL — AB
Specific Gravity, Urine: 1.015 (ref 1.005–1.030)
pH: 6 (ref 5.0–8.0)

## 2023-10-04 LAB — TSH: TSH: 0.54 u[IU]/mL (ref 0.350–4.500)

## 2023-10-04 MED ORDER — ACETAMINOPHEN 650 MG RE SUPP: 650.0000 mg | RECTAL | Status: DC | PRN

## 2023-10-04 MED ORDER — IPRATROPIUM BROMIDE 0.06 % NA SOLN: 2.0000 | Freq: Four times a day (QID) | NASAL | Status: DC

## 2023-10-04 MED ORDER — SENNOSIDES-DOCUSATE SODIUM 8.6-50 MG PO TABS
1.0000 | ORAL_TABLET | Freq: Two times a day (BID) | ORAL | Status: DC
Start: 2023-10-04 — End: 2023-10-08
  Administered 2023-10-04 – 2023-10-08 (×8): 1 via ORAL
  Filled 2023-10-04 (×9): qty 1

## 2023-10-04 MED ORDER — CLEVIDIPINE BUTYRATE 0.5 MG/ML IV EMUL
0.0000 mg/h | INTRAVENOUS | Status: DC
  Filled 2023-10-04: qty 50

## 2023-10-04 MED ORDER — NALOXONE HCL 4 MG/0.1ML NA LIQD: 4.0000 mg | NASAL | Status: DC | PRN

## 2023-10-04 MED ORDER — SODIUM CHLORIDE 1 G PO TABS
2.0000 g | ORAL_TABLET | Freq: Three times a day (TID) | ORAL | Status: DC
  Administered 2023-10-05 – 2023-10-08 (×10): 2 g via ORAL
  Filled 2023-10-04 (×10): qty 2

## 2023-10-04 MED ORDER — SODIUM CHLORIDE 1 G PO TABS
1.0000 g | ORAL_TABLET | Freq: Once | ORAL | Status: AC
  Administered 2023-10-05: 1 g via ORAL

## 2023-10-04 MED ORDER — MORPHINE SULFATE ER 15 MG PO TBCR
15.0000 mg | EXTENDED_RELEASE_TABLET | Freq: Every day | ORAL | Status: DC
  Administered 2023-10-04 – 2023-10-06 (×4): 15 mg via ORAL
  Filled 2023-10-04 (×5): qty 1

## 2023-10-04 MED ORDER — AMLODIPINE BESYLATE 10 MG PO TABS
10.0000 mg | ORAL_TABLET | Freq: Every day | ORAL | Status: DC
  Administered 2023-10-04 – 2023-10-08 (×5): 10 mg via ORAL
  Filled 2023-10-04: qty 2
  Filled 2023-10-04 (×4): qty 1

## 2023-10-04 MED ORDER — LABETALOL HCL 5 MG/ML IV SOLN
20.0000 mg | Freq: Once | INTRAVENOUS | Status: AC
  Administered 2023-10-04: 20 mg via INTRAVENOUS
  Filled 2023-10-04: qty 4

## 2023-10-04 MED ORDER — PANTOPRAZOLE SODIUM 40 MG IV SOLR
40.0000 mg | Freq: Every day | INTRAVENOUS | Status: DC
  Administered 2023-10-04 – 2023-10-05 (×3): 40 mg via INTRAVENOUS
  Filled 2023-10-04 (×3): qty 10

## 2023-10-04 MED ORDER — ONDANSETRON HCL 4 MG/2ML IJ SOLN: 4.0000 mg | INTRAMUSCULAR | Status: DC | PRN

## 2023-10-04 MED ORDER — HYDRALAZINE HCL 20 MG/ML IJ SOLN
10.0000 mg | Freq: Four times a day (QID) | INTRAMUSCULAR | Status: DC | PRN
  Administered 2023-10-04 – 2023-10-06 (×2): 10 mg via INTRAVENOUS
  Filled 2023-10-04 (×2): qty 1

## 2023-10-04 MED ORDER — SODIUM CHLORIDE 1 G PO TABS: 1.0000 g | ORAL_TABLET | Freq: Three times a day (TID) | ORAL | Status: DC

## 2023-10-04 MED ORDER — OXYCODONE HCL ER 10 MG PO T12A: 10.0000 mg | EXTENDED_RELEASE_TABLET | Freq: Two times a day (BID) | ORAL | Status: DC

## 2023-10-04 MED ORDER — HYDROCODONE-ACETAMINOPHEN 7.5-325 MG PO TABS
1.0000 | ORAL_TABLET | Freq: Three times a day (TID) | ORAL | Status: DC | PRN
  Administered 2023-10-04 – 2023-10-08 (×3): 1 via ORAL
  Filled 2023-10-04 (×4): qty 1

## 2023-10-04 MED ORDER — APIXABAN (ELIQUIS) VTE STARTER PACK (10MG AND 5MG): 5.0000 mg | ORAL_TABLET | ORAL | Status: DC

## 2023-10-04 MED ORDER — STROKE: EARLY STAGES OF RECOVERY BOOK: Freq: Once | Status: AC

## 2023-10-04 MED ORDER — AMLODIPINE BESYLATE 5 MG PO TABS: 5.0000 mg | ORAL_TABLET | Freq: Every day | ORAL | Status: DC

## 2023-10-04 MED ORDER — SODIUM CHLORIDE 1 G PO TABS
1.0000 g | ORAL_TABLET | Freq: Three times a day (TID) | ORAL | Status: DC
  Administered 2023-10-04 (×3): 1 g via ORAL
  Filled 2023-10-04 (×3): qty 1

## 2023-10-04 MED ORDER — TRAZODONE HCL 50 MG PO TABS
25.0000 mg | ORAL_TABLET | Freq: Every evening | ORAL | Status: DC | PRN
  Administered 2023-10-04 – 2023-10-07 (×3): 25 mg via ORAL
  Filled 2023-10-04 (×3): qty 1

## 2023-10-04 MED ORDER — ACETAMINOPHEN 325 MG PO TABS
650.0000 mg | ORAL_TABLET | ORAL | Status: DC | PRN
  Administered 2023-10-04 – 2023-10-07 (×3): 650 mg via ORAL
  Filled 2023-10-04 (×3): qty 2

## 2023-10-04 MED ORDER — GABAPENTIN 300 MG PO CAPS
300.0000 mg | ORAL_CAPSULE | Freq: Three times a day (TID) | ORAL | Status: DC
  Administered 2023-10-04 – 2023-10-08 (×13): 300 mg via ORAL
  Filled 2023-10-04 (×4): qty 1
  Filled 2023-10-04 (×2): qty 3
  Filled 2023-10-04 (×9): qty 1

## 2023-10-04 MED ORDER — ACETAMINOPHEN 160 MG/5ML PO SOLN: 650.0000 mg | ORAL | Status: DC | PRN

## 2023-10-04 MED ORDER — ENALAPRIL MALEATE 10 MG PO TABS
10.0000 mg | ORAL_TABLET | Freq: Every day | ORAL | Status: DC
  Administered 2023-10-04 – 2023-10-08 (×5): 10 mg via ORAL
  Filled 2023-10-04 (×5): qty 1

## 2023-10-04 MED ORDER — BENZONATATE 100 MG PO CAPS: 200.0000 mg | ORAL_CAPSULE | Freq: Three times a day (TID) | ORAL | Status: DC | PRN

## 2023-10-04 NOTE — Progress Notes (Addendum)
 Interim progress note not for billing  I have seen and examined the patient, reviewed the chart, and agree with the assessment and plan unless otherwise stipulated. Hx advanced dementia, history pe no longer anticoagulated, hgn, chronic pain on opioids, brought in by son for severe hypertension at home, also frequent falls. She has bruises on her face and lower extremities. Neuroimaging shows bilateral subdural and frontotemporal bleeds, appear to be stable. Neurosurgery has been consulted. Baseline sodium low 130s here 129, subdural may be contributing. Bp has improved to 159 systolic on last check. Patient's mental status is close to her baseline per son. Given advanced dementia prognosis is poor, son shares he does not think his mother would want to continue life-extending treatments, he is considering transition to hospice/comfort care but wishes to think this over, I will consult palliative. Will increase home amlodipine to 10 and add enalapril 10 and continue prn hydralazine. Will add salt tabs with meals and fluid restrict, will repeat a bmp this afternoon. Will order pt/ot consults. And will check x-rays of lower extremities, which are bruised.   Update sodium this afternoon 126, reviewed with dr. Richardson Dopp of icu to see if hypertonic saline indicated (as would need icu transfer), he advises increasing salt tabs to 2 tid for now.

## 2023-10-04 NOTE — Consult Note (Signed)
 Neurosurgery-New Consultation Evaluation 10/04/2023 Hannah Dillon [id number]  Identifying Statement: Hannah Dillon is a 89 y.o. female from [facility] [place] [address] with  a history of OA, cervical radiculopathy. PE, HTN, and dementia  Physician Requesting Consultation: No ref. provider found  History of Present Illness: Hannah Dillon is a 89 year old presenting to the emergency department after multiple falls at home witnessed by her son and altered mental status.  At presentation to the ED she had a significantly elevated blood pressure but otherwise normal vital signs.  Per son's report she has had falls over the course of several months this is the first time with sustained altered mental status.  At home she is ambulatory with some help and lives with her son.  The patient is unable to provide any additional history.  Per the son she has not complained of any pain.  Past Medical History:  Past Medical History:  Diagnosis Date   Arthritis    Cervical radiculopathy    COVID-19 virus infection 07/27/2021   DJD (degenerative joint disease)    Left arm pain     Social History: Social History   Socioeconomic History   Marital status: Widowed    Spouse name: Not on file   Number of children: Not on file   Years of education: Not on file   Highest education level: Not on file  Occupational History   Not on file  Tobacco Use   Smoking status: Never   Smokeless tobacco: Never  Vaping Use   Vaping status: Never Used  Substance and Sexual Activity   Alcohol use: No   Drug use: No   Sexual activity: Not on file  Other Topics Concern   Not on file  Social History Narrative   Not on file   Social Drivers of Health   Financial Resource Strain: Not on file  Food Insecurity: Not on file  Transportation Needs: Not on file  Physical Activity: Not on file  Stress: Not on file  Social Connections: Not on file  Intimate Partner Violence: Not on file   Living arrangements (living  alone, with partner): with son  Family History: History reviewed. No pertinent family history.  Review of Systems:  Unable to obtain due to patients mental status  Physical Exam: BP (!) 159/72   Pulse 69   Temp 98 F (36.7 C) (Oral)   Resp 15   Ht 5' (1.524 m)   Wt 59 kg   SpO2 100%   BMI 25.40 kg/m  Body mass index is 25.4 kg/m. Body surface area is 1.58 meters squared. General appearance: Alert, no acute distress Head: Normocephalic, ecchymosis around left orbit Eyes: small but reactive to light  Oropharynx: Moist without lesions Neck: Supple, no tenderness Lungs:  good air exchange Abdomen: Soft, nondistended Ext: No edema in LE bilaterally  Neurologic exam:  Mental status: alertness: alert, but disoriented to person, place and time Speech: answers yes to questions. Largely incoherent  Cranial nerves:  CN appear grossly intact however difficult to fully assess as patient does not readily follow commands Motor:Pt unable to follow commands but does spontaneously move all extremities Sensory: intact to light touch in all extremities Reflexes: 3+ and symmetric bilaterally for arms and legs. Does not appear to have a Hoffman's  Coordination: unable to assess Gait: unable to assess   Laboratory: Results for orders placed or performed during the hospital encounter of 10/03/23  CBC   Collection Time: 10/03/23 10:55 PM  Result Value Ref Range  WBC 9.6 4.0 - 10.5 K/uL   RBC 3.97 3.87 - 5.11 MIL/uL   Hemoglobin 13.1 12.0 - 15.0 g/dL   HCT 16.1 09.6 - 04.5 %   MCV 103.0 (H) 80.0 - 100.0 fL   MCH 33.0 26.0 - 34.0 pg   MCHC 32.0 30.0 - 36.0 g/dL   RDW 40.9 81.1 - 91.4 %   Platelets 249 150 - 400 K/uL   nRBC 0.0 0.0 - 0.2 %  TSH   Collection Time: 10/03/23 10:55 PM  Result Value Ref Range   TSH 0.540 0.350 - 4.500 uIU/mL  Troponin I (High Sensitivity)   Collection Time: 10/03/23 10:55 PM  Result Value Ref Range   Troponin I (High Sensitivity) 8 <18 ng/L  Basic  metabolic panel   Collection Time: 10/03/23 11:00 PM  Result Value Ref Range   Sodium 129 (L) 135 - 145 mmol/L   Potassium 4.7 3.5 - 5.1 mmol/L   Chloride 95 (L) 98 - 111 mmol/L   CO2 18 (L) 22 - 32 mmol/L   Glucose, Bld 175 (H) 70 - 99 mg/dL   BUN 22 8 - 23 mg/dL   Creatinine, Ser 7.82 0.44 - 1.00 mg/dL   Calcium 9.0 8.9 - 95.6 mg/dL   GFR, Estimated >21 >30 mL/min   Anion gap 16 (H) 5 - 15  Troponin I (High Sensitivity)   Collection Time: 10/03/23 11:00 PM  Result Value Ref Range   Troponin I (High Sensitivity) 8 <18 ng/L  Urinalysis, Routine w reflex microscopic -Urine, Clean Catch   Collection Time: 10/03/23 11:50 PM  Result Value Ref Range   Color, Urine YELLOW (A) YELLOW   APPearance CLOUDY (A) CLEAR   Specific Gravity, Urine 1.015 1.005 - 1.030   pH 6.0 5.0 - 8.0   Glucose, UA NEGATIVE NEGATIVE mg/dL   Hgb urine dipstick SMALL (A) NEGATIVE   Bilirubin Urine NEGATIVE NEGATIVE   Ketones, ur 5 (A) NEGATIVE mg/dL   Protein, ur 865 (A) NEGATIVE mg/dL   Nitrite NEGATIVE NEGATIVE   Leukocytes,Ua NEGATIVE NEGATIVE   RBC / HPF 0-5 0 - 5 RBC/hpf   WBC, UA 0-5 0 - 5 WBC/hpf   Bacteria, UA MANY (A) NONE SEEN   Squamous Epithelial / HPF 0-5 0 - 5 /HPF   Mucus PRESENT    I personally reviewed labs  Imaging:  I personally reviewed radiology studies to include:  CT head 10/04/23 FINDINGS: Brain: There is generalized cerebral atrophy with widening of the extra-axial spaces and ventricular dilatation. There are areas of decreased attenuation within the white matter tracts of the supratentorial brain, consistent with microvascular disease changes.   Stable, chronic, approximately 6 mm thick bilateral subdural hematomas versus subdural hygromas are seen. A small amount of superimposed acute blood is noted within the frontotemporal region on the right. This is stable in appearance when compared to the prior exam. Stable subcentimeter foci of increased attenuation are also  again seen within the left frontal lobe (axial CT images 18 and 20, CT series 2/sagittal reformatted image 30, CT series 5).   There is no evidence of mass effect or midline shift.   Vascular: Moderate to marked severity bilateral cavernous carotid artery calcification is noted.   Skull: Normal. Negative for fracture or focal lesion.   Sinuses/Orbits: No acute finding.   Other: None.   IMPRESSION: 1. Stable, chronic, approximately 6 mm thick bilateral subdural hematomas versus subdural hygromas. 2. Small amount of superimposed acute blood within the frontotemporal region on  the right, stable in appearance when compared to the prior exam. 3. Stable subcentimeter foci of increased attenuation within the left frontal lobe, which may represent small foci of acute hemorrhage. MRI correlation is recommended. 4. Generalized cerebral atrophy and microvascular disease changes of the supratentorial brain.     Electronically Signed   By: Aram Candela M.D.   On: 10/04/2023 02:39  CT C spine 10/04/23 FINDINGS: Alignment: Normal   Skull base and vertebrae: No acute fracture. No primary bone lesion or focal pathologic process.   Soft tissues and spinal canal: No prevertebral fluid or swelling. No visible canal hematoma.   Disc levels:  Diffuse degenerative disc disease and facet disease.   Upper chest: No acute findings   Other: None   IMPRESSION: Degenerative disc and facet disease.  No acute bony abnormality.     Electronically Signed   By: Charlett Nose M.D.   On: 10/04/2023 00:14   Impression/Plan:     1.  Diagnosis:  Stable bilateral subdural fluid collections with overlying right acute frontotemporal SDH without shift.   2.  Plan - admit to medicine - no plans for neurosurgical intervention at this time.  Will continue to remain available for any questions or concerns. -Will plan to follow up outpatient in 2 weeks with repeat head CT. - please call with  any questions or concerns  Manning Charity PA-C Neurosurgery

## 2023-10-04 NOTE — Assessment & Plan Note (Signed)
-   The patient will be continued on his antihypertensive therapy. - We will place on as needed IV hydralazine and labetalol. - Will maintain systolic BP below 160.

## 2023-10-04 NOTE — Telephone Encounter (Signed)
 Susanne Borders, PA  P [facility]Neurosurgery Admin Pt needs outpatient follow up with repeat CT head in 2 weeks. Thank you

## 2023-10-04 NOTE — Evaluation (Signed)
 Clinical/Bedside Swallow Evaluation Patient Details  Name: Hannah Dillon MRN: [id number] Date of Birth: 06-18-1923  Today's Date: 10/04/2023 Time: SLP Start Time (ACUTE ONLY): 0845 SLP Stop Time (ACUTE ONLY): 0940 SLP Time Calculation (min) (ACUTE ONLY): 55 min  Past Medical History:  Past Medical History:  Diagnosis Date   Arthritis    Cervical radiculopathy    COVID-19 virus infection 07/27/2021   DJD (degenerative joint disease)    Left arm pain    Past Surgical History: History reviewed. No pertinent surgical history. HPI:  Pt is a 89 y.o. Caucasian female with medical history significant for Dementia per previous admit/chart note(2023), osteoarthritis, cervical radiculopathy, essential hypertension, pulmonary embolism.  Per ED provider note previous admit, PMH includes Dementia.  Pt admitted from home. Son reports pt has high blood pressure and lethargy today. Pt fell 2 days ago slipping in the bathtub w/ bruising to left eye/face. She has a walker at home but does not always use it per Son.  Head CT at admit:  1. stable, chronic, approximately 6 mm thick bilateral subdural  hematomas versus subdural hygromas.  2. Small amount of superimposed acute blood within the  frontotemporal region on the right, stable in appearance when  compared to the prior exam.  3. Stable subcentimeter foci of increased attenuation within the  left frontal lobe, which may represent small foci of acute  hemorrhage. MRI correlation is recommended.  4. Cerebral atrophy and microvascular disease changes of  the supratentorial brain.  CXR: No active disease.    Assessment / Plan / Recommendation  Clinical Impression   Pt seen for BSE this morning. Pt awake, sitting up in bed. Son present - he assisted in feeding pt breakfast tray presented by SLP. Pt was alert, confused; oriented to her name/Son only. Pt has Baseline dx'd Dementia per chart(2023). Pt was easily distracted by the pulse ox; required cues to redirect  attention to tasks. On RA, afebrile, WBC WNL.   Pt appears to present w/ grossly functional oropharyngeal phase swallowing w/ min decreased oral phase awareness and increased Time w/ boluses of increased texture -- suspect in setting of declined Cognitive status; Baseline Dementia per chart(2023). Son endorsed she needed extra cues in the home during ADLs. ANY Cognitive decline can impact overall awareness/timing of swallow and safety during po tasks which increases risk for aspiration, choking. Pt's risk for aspiration can be reduced when following general aspiration precautions and using a modified diet consistency of CHOPPED/broken down foods for ease of mastication/intake in general. Pt wears Dentures for eating.  She required Mod verbal/visual cues/tactile cues intermittently for follow through during po tasks and self-feeding.        Pt consumed several trials of soft solids/minced foods, purees, and thin liquids via Straw w/ No overt, clinical s/s of aspiration noted: no decline in vocal quality, no cough, and no decline in respiratory status during/post trials. O2 sats remained 97-99%. Oral phase was functional for bolus management and oral clearing of the boluses given. Timely A-P transfer of liquids. Mastication of softened, cut solids was adequate given Time and moistening the foods well. Pt attempted self-feeding of holding Cup to drink but required min-mod support and guidance d/t the Cognitive decline, and easily distracted. Being able to feed self improves safety of swallowing.  Cursory OM Exam appeared [facility] w/ No unilateral weakness noted. Some confusion of OM tasks and oral care noted.          In setting of baseline Dementia and Cognitive  decline, recommend a more dysphagia level 3(mech soft) diet w/ MINCED meats for ease of oral phase/mastication; gravies added to moisten well. Thin liquids -- monitor cup/straw drinking AND pt should Hold Cup when Drinking. Recommend general aspiration  precautions; reduce Distractions/Talking during meals and engage pt in self-feeding during meals. Tray setup and support at meals as needed. Pills Crushed in Puree for safer swallowing -- recommended now and at D/C to Son.  MD/NSG updated.   Pt appears at/near her swallowing Baseline per presentation and Son. No further skilled ST services indicated at this time. Any f/u re: Cognitive-linguistic needs can be had at her next venue of care as pt has Baseline Cognitive decline reported per chart notes. Recommends follow-up w/ Palliative Care for GOC and education re: impact of Cognitive decline/Dementia on oral intake/swallowing/behavior. Precautions posted in room, chart. Son agreed. SLP Visit Diagnosis: Dysphagia, oral phase (R13.11) (min decreased awareness - suspect impact of Baseline Cognitive decline w/ acute hospitalization and advanced Age)    Aspiration Risk  Mild aspiration risk;Risk for inadequate nutrition/hydration (reduced following general aspiration precautions)    Diet Recommendation   Thin;Dysphagia 3 (mechanical soft) (meats MINCED w/ gravies) = a more dysphagia level 3(mech soft) diet w/ MINCED meats for ease of oral phase/mastication; gravies added to moisten well. Thin liquids -- monitor cup/straw drinking AND pt should Hold Cup when Drinking. Recommend general aspiration precautions; reduce Distractions/Talking during meals and engage pt in self-feeding during meals. Tray setup and support at meals as needed.   Medication Administration: Crushed with puree (d/t Cognitive decline)    Other  Recommendations Recommended Consults:  (Dietician f/u) Oral Care Recommendations: Oral care BID;Oral care before and after PO;Staff/trained caregiver to provide oral care (Denture care)    Recommendations for follow up therapy are one component of a multi-disciplinary discharge planning process, led by the attending physician.  Recommendations may be updated based on patient status, additional  functional criteria and insurance authorization.  Follow up Recommendations No SLP follow up expected at d/c     Assistance Recommended at Discharge  Full d/t Cognitive decline baseline  Functional Status Assessment Patient has had a recent decline in their functional status and demonstrates the ability to make significant improvements in function in a reasonable and predictable amount of time.  Frequency and Duration  (n/a)   (n/a)       Prognosis Prognosis for improved oropharyngeal function: Fair (-Good) Barriers to Reach Goals: Cognitive deficits;Language deficits;Time post onset;Severity of deficits Barriers/Prognosis Comment: suspect impact of Baseline Cognitive decline w/ acute hospitalization and advanced Age      Swallow Study   General Date of Onset: 10/03/23 HPI: Pt is a 89 y.o. Caucasian female with medical history significant for Dementia per previous admit/chart note(2023), osteoarthritis, cervical radiculopathy, essential hypertension, pulmonary embolism.  Per ED provider note previous admit, PMH includes Dementia.  Pt admitted from home. Son reports pt has high blood pressure and lethargy today. Pt fell 2 days ago slipping in the bathtub w/ bruising to left eye/face. She has a walker at home but does not always use it per Son.  Head CT at admit:  1. stable, chronic, approximately 6 mm thick bilateral subdural  hematomas versus subdural hygromas.  2. Small amount of superimposed acute blood within the  frontotemporal region on the right, stable in appearance when  compared to the prior exam.  3. Stable subcentimeter foci of increased attenuation within the  left frontal lobe, which may represent small foci of acute  hemorrhage. MRI correlation is recommended.  4. Cerebral atrophy and microvascular disease changes of  the supratentorial brain.  CXR: No active disease. Type of Study: Bedside Swallow Evaluation Previous Swallow Assessment: none Diet Prior to this Study:  Regular;Thin liquids (Level 0) (per Son at home - he cuts foods) Temperature Spikes Noted: No (wbc 9.6) Respiratory Status: Room air History of Recent Intubation: No Behavior/Cognition: Alert;Cooperative;Pleasant mood;Confused;Distractible;Requires cueing Oral Cavity Assessment: Within Functional Limits Oral Care Completed by SLP: Recent completion by staff Oral Cavity - Dentition: Dentures, top;Dentures, bottom (in place - Denture supplies given for cleaning) Vision: Functional for self-feeding Self-Feeding Abilities: Able to feed self;Needs assist;Needs set up;Total assist (confusion but could hold Cup independently) Patient Positioning: Upright in bed (supported positioning - MOD+) Baseline Vocal Quality: Normal Volitional Cough: Cognitively unable to elicit Volitional Swallow: Unable to elicit    Oral/Motor/Sensory Function Overall Oral Motor/Sensory Function: Within functional limits (no unilateral weakness noted)   Ice Chips Ice chips: Not tested   Thin Liquid Thin Liquid: Within functional limits Presentation: Self Fed;Straw (15+ trials; ~5-6 ozs total) Other Comments: water, Ensure    Nectar Thick Nectar Thick Liquid: Not tested   Honey Thick Honey Thick Liquid: Not tested   Puree Puree: Within functional limits Presentation: Spoon (fed by Son; 5-6 trials)   Solid     Solid: Impaired (min) Presentation: Spoon (fed by Son; 10 trials) Oral Phase Impairments: Poor awareness of bolus Oral Phase Functional Implications:  (more vertical mastication) Pharyngeal Phase Impairments:  (none) Other Comments: cleared orally when alternating foods/liquids        Jerilynn Som, MS, CCC-SLP Speech Language Pathologist Rehab Services; [facility] [phone] (ascom) Jamilynn Whitacre 10/04/2023,12:59 PM

## 2023-10-04 NOTE — Evaluation (Signed)
 Occupational Therapy Evaluation Patient Details Name: Hannah Dillon MRN: [id number] DOB: [birth year] Today's Date: 10/04/2023   History of Present Illness   Hannah Dillon is a 89 year old presenting to the emergency department after multiple falls at home witnessed by her son and altered mental status. Found to have stable bilateral subdural fluid collections with overlying right acute frontotemporal SDH without shift.   Clinical Impressions Hannah Dillon was seen for OT evaluation this date. Pt is poor historian, per chart pt ambulatory at home with son. Pleasantly confused, requires MAX cues to redirect to task and for safety with transfers. CGA sup<>sit, fair sitting balance. MOD A sit<>stand x 3 trials with BLE tremors and buckling in standing however achieves lift off no assist and impulsively attempts to stand multiple times t/o session.   Noted to have liquid stool. MAX A x2 pericare standing. CGA don/doff gown in sitting. Clean linen change provided. RN notified pt is impulsive and a high fall risk. Pt would benefit from skilled OT to address noted impairments and functional limitations (see below for any additional details). Upon hospital discharge, recommend 24/7 SUPERVISION and assistance for all ADLs. Pt would benefit from return to familiar environment however if unable to return home recommend LTC with follow up OT.     If plan is discharge home, recommend the following:   Supervision due to cognitive status;Help with stairs or ramp for entrance;Assistance with feeding;Two people to help with bathing/dressing/bathroom;Two people to help with walking and/or transfers     Functional Status Assessment   Patient has had a recent decline in their functional status and demonstrates the ability to make significant improvements in function in a reasonable and predictable amount of time.     Equipment Recommendations   BSC/3in1;Wheelchair (measurements OT)     Recommendations for  Other Services         Precautions/Restrictions   Precautions Precautions: Fall Recall of Precautions/Restrictions: Impaired Restrictions Weight Bearing Restrictions Per Provider Order: No     Mobility Bed Mobility Overal bed mobility: Needs Assistance Bed Mobility: Supine to Sit, Sit to Supine, Rolling Rolling: Min assist   Supine to sit: Contact guard Sit to supine: Contact guard assist   General bed mobility comments: cues to redirect as pt is impulsive    Transfers Overall transfer level: Needs assistance Equipment used: 1 person hand held assist Transfers: Sit to/from Stand, Bed to chair/wheelchair/BSC Sit to Stand: Mod assist          Lateral/Scoot Transfers: Mod assist General transfer comment: BLE tremors and buckling in standing however achieves lift off no assist and impulsively attempts to stand multiple times      Balance Overall balance assessment: Needs assistance Sitting-balance support: No upper extremity supported, Feet supported Sitting balance-Leahy Scale: Good     Standing balance support: Bilateral upper extremity supported Standing balance-Leahy Scale: Zero                             ADL either performed or assessed with clinical judgement   ADL Overall ADL's : Needs assistance/impaired                                       General ADL Comments: MAX A x2 pericare standing. CGA don/doff gown in sitting      Pertinent Vitals/Pain Pain Assessment Pain Assessment: PAINAD Breathing: normal Negative Vocalization: none  Facial Expression: smiling or inexpressive Body Language: relaxed Consolability: no need to console PAINAD Score: 0     Extremity/Trunk Assessment Upper Extremity Assessment Upper Extremity Assessment: Overall WFL for tasks assessed   Lower Extremity Assessment Lower Extremity Assessment: Generalized weakness       Communication Communication Communication:  (tangential  speech)   Cognition Arousal: Alert Behavior During Therapy: Impulsive Cognition: History of cognitive impairments             OT - Cognition Comments: follows 1 step commands with reptition, cueing, increased time                 Following commands: Impaired Following commands impaired: Follows one step commands inconsistently     Cueing  General Comments   Cueing Techniques: Tactile cues;Visual cues;Verbal cues;Gestural cues      Exercises     Shoulder Instructions      Home Living Family/patient expects to be discharged to:: Private residence Living Arrangements: Children   Type of Home: House Home Access: Stairs to enter Secretary/administrator of Steps: 3 Entrance Stairs-Rails: Right Home Layout: One level                   Additional Comments: setup per chart, pt unable to answer      Prior Functioning/Environment Prior Level of Function : Patient poor historian/Family not available                    OT Problem List: Decreased strength;Decreased activity tolerance;Impaired balance (sitting and/or standing)   OT Treatment/Interventions: Self-care/ADL training;Therapeutic exercise;DME and/or AE instruction;Energy conservation;Therapeutic activities      OT Goals(Current goals can be found in the care plan section)   Acute Rehab OT Goals Patient Stated Goal: to lie down OT Goal Formulation: Patient unable to participate in goal setting Time For Goal Achievement: 10/18/23 Potential to Achieve Goals: Fair ADL Goals Pt Will Perform Grooming: with set-up;with supervision;sitting Pt Will Perform Lower Body Dressing: with min assist;sit to/from stand Pt Will Transfer to Toilet: with min assist;ambulating;bedside commode   OT Frequency:  Min 1X/week    Co-evaluation PT/OT/SLP Co-Evaluation/Treatment: Yes Reason for Co-Treatment: Necessary to address cognition/behavior during functional activity;For patient/therapist safety PT  goals addressed during session: Mobility/safety with mobility OT goals addressed during session: ADL's and self-care      AM-PAC OT "6 Clicks" Daily Activity     Outcome Measure Help from another person eating meals?: A Little Help from another person taking care of personal grooming?: A Lot Help from another person toileting, which includes using toliet, bedpan, or urinal?: A Lot Help from another person bathing (including washing, rinsing, drying)?: A Lot Help from another person to put on and taking off regular upper body clothing?: A Little Help from another person to put on and taking off regular lower body clothing?: A Lot 6 Click Score: 14   End of Session Nurse Communication: Mobility status  Activity Tolerance: Patient tolerated treatment well Patient left: in bed;with call bell/phone within reach;with bed alarm set  OT Visit Diagnosis: Other abnormalities of gait and mobility (R26.89);Muscle weakness (generalized) (M62.81)                Time: 2130-8657 OT Time Calculation (min): 17 min Charges:  OT General Charges $OT Visit: 1 Visit OT Evaluation $OT Eval Low Complexity: 1 Low  Kathie Dike, M.S. OTR/L  10/04/23, 3:02 PM  ascom [phone]

## 2023-10-04 NOTE — Assessment & Plan Note (Addendum)
-   The patient will be admitted to a progressive unit bed. - We will closely follow neurochecks and mental status. - Neurosurgery consult to be obtained. - Dr. Myer Haff was notified about the patient. - Repeated CT scan will be obtained to follow-up. - We will avoid any blood thinners.  He used to be on Eliquis in the past but stopped it.

## 2023-10-04 NOTE — Evaluation (Signed)
 Physical Therapy Evaluation Patient Details Name: Hannah Dillon MRN: [id number] DOB: 01/02/1923 Today's Date: 10/04/2023  History of Present Illness  Alaria Oconnor is a 89 year old presenting to the emergency department after multiple falls at home witnessed by her son and altered mental status. Found to have stable bilateral subdural fluid collections with overlying right acute frontotemporal SDH without shift.  Clinical Impression  Patient admitted with the above. Per chart, patient lives with son and ambulatory at home. Patient is poor historian 2/2 advanced dementia. Patient is impulsive and requires max cues for redirection to task for safety. Requires CGA for bed mobility and modA to stand x 3 at EOB with BLE tremors and buckling. Liquid stool on arrival and after standing requiring maxA+2 for pericare in standing. RN notified of patient's impulsivity and very high fall risk. At discharge, patient will require 24/7 SUPERVISION and assistance with mobility. Patient will benefit from skilled PT services during acute stay to address listed deficits. Patient will benefit from ongoing therapy at discharge to maximize functional independence and safety. If unable to return home with family, recommend LTC with PT follow up.         If plan is discharge home, recommend the following: A lot of help with walking and/or transfers;A lot of help with bathing/dressing/bathroom;Assistance with cooking/housework;Assistance with feeding;Direct supervision/assist for medications management;Direct supervision/assist for financial management;Assist for transportation;Help with stairs or ramp for entrance;Supervision due to cognitive status   Can travel by private vehicle        Equipment Recommendations Wheelchair (measurements PT);BSC/3in1  Recommendations for Other Services       Functional Status Assessment Patient has had a recent decline in their functional status and demonstrates the ability to make  significant improvements in function in a reasonable and predictable amount of time.     Precautions / Restrictions Precautions Precautions: Fall Recall of Precautions/Restrictions: Impaired Restrictions Weight Bearing Restrictions Per Provider Order: No      Mobility  Bed Mobility Overal bed mobility: Needs Assistance Bed Mobility: Supine to Sit, Sit to Supine, Rolling Rolling: Min assist   Supine to sit: Contact guard Sit to supine: Contact guard assist   General bed mobility comments: cues to redirect as pt is impulsive    Transfers Overall transfer level: Needs assistance Equipment used: 1 person hand held assist Transfers: Sit to/from Stand, Bed to chair/wheelchair/BSC Sit to Stand: Mod assist          Lateral/Scoot Transfers: Mod assist General transfer comment: BLE tremors and buckling in standing however achieves lift off no assist and impulsively attempts to stand multiple times    Ambulation/Gait                  Stairs            Wheelchair Mobility     Tilt Bed    Modified Rankin (Stroke Patients Only)       Balance Overall balance assessment: Needs assistance Sitting-balance support: No upper extremity supported, Feet supported Sitting balance-Leahy Scale: Good     Standing balance support: Bilateral upper extremity supported Standing balance-Leahy Scale: Zero                               Pertinent Vitals/Pain Pain Assessment Pain Assessment: PAINAD Breathing: normal Negative Vocalization: none Facial Expression: smiling or inexpressive Body Language: relaxed Consolability: no need to console PAINAD Score: 0    Home Living Family/patient expects to be discharged  to:: Private residence Living Arrangements: Children   Type of Home: House Home Access: Stairs to enter Entrance Stairs-Rails: Right Entrance Stairs-Number of Steps: 3   Home Layout: One level   Additional Comments: setup per chart, pt  unable to answer    Prior Function Prior Level of Function : Patient poor historian/Family not available                     Extremity/Trunk Assessment   Upper Extremity Assessment Upper Extremity Assessment: Generalized weakness    Lower Extremity Assessment Lower Extremity Assessment: Generalized weakness       Communication   Communication Communication:  (tangential speech)    Cognition Arousal: Alert Behavior During Therapy: Impulsive   PT - Cognitive impairments: History of cognitive impairments                         Following commands: Impaired Following commands impaired: Follows one step commands inconsistently     Cueing Cueing Techniques: Tactile cues, Visual cues, Verbal cues, Gestural cues     General Comments      Exercises     Assessment/Plan    PT Assessment Patient needs continued PT services  PT Problem List Decreased strength;Decreased activity tolerance;Decreased balance;Decreased mobility;Decreased coordination;Decreased cognition;Decreased knowledge of use of DME;Decreased safety awareness;Decreased knowledge of precautions;Cardiopulmonary status limiting activity       PT Treatment Interventions DME instruction;Gait training;Therapeutic activities;Functional mobility training;Therapeutic exercise;Balance training;Neuromuscular re-education;Patient/family education    PT Goals (Current goals can be found in the Care Plan section)  Acute Rehab PT Goals Patient Stated Goal: did not state PT Goal Formulation: Patient unable to participate in goal setting Time For Goal Achievement: 10/18/23 Potential to Achieve Goals: Fair    Frequency Min 1X/week     Co-evaluation PT/OT/SLP Co-Evaluation/Treatment: Yes Reason for Co-Treatment: Necessary to address cognition/behavior during functional activity;For patient/therapist safety PT goals addressed during session: Mobility/safety with mobility OT goals addressed during  session: ADL's and self-care       AM-PAC PT "6 Clicks" Mobility  Outcome Measure Help needed turning from your back to your side while in a flat bed without using bedrails?: A Little Help needed moving from lying on your back to sitting on the side of a flat bed without using bedrails?: A Little Help needed moving to and from a bed to a chair (including a wheelchair)?: A Lot Help needed standing up from a chair using your arms (e.g., wheelchair or bedside chair)?: A Lot Help needed to walk in hospital room?: Total Help needed climbing 3-5 steps with a railing? : Total 6 Click Score: 12    End of Session   Activity Tolerance: Patient tolerated treatment well Patient left: in bed;with call bell/phone within reach;with bed alarm set Nurse Communication: Mobility status PT Visit Diagnosis: Unsteadiness on feet (R26.81);Muscle weakness (generalized) (M62.81);History of falling (Z91.81)    Time: 1610-9604 PT Time Calculation (min) (ACUTE ONLY): 17 min   Charges:   PT Evaluation $PT Eval Moderate Complexity: 1 Mod   PT General Charges $$ ACUTE PT VISIT: 1 Visit         Maylon Peppers, PT, DPT Physical Therapist - [facility]  [facility]   Earlisha Sharples A Satcha Storlie 10/04/2023, 3:23 PM

## 2023-10-04 NOTE — Assessment & Plan Note (Addendum)
 The patient used to be on Eliquis and quit a while ago.

## 2023-10-04 NOTE — ED Notes (Signed)
 Attempted giving pt medication whole with applesauce. Pt chewing on pills. Pills crushed at this time. Pt tolerated well.

## 2023-10-04 NOTE — Telephone Encounter (Signed)
 Patient is still admitted, will contact her after she is discharged

## 2023-10-04 NOTE — Assessment & Plan Note (Signed)
-   He has cervical radiculopathy. - We will continue Neurontin.

## 2023-10-04 NOTE — H&P (Addendum)
 [place]   PATIENT NAME: Hannah Dillon    MR#:  [id number]  DATE OF BIRTH:  1922-11-30  DATE OF ADMISSION:  10/03/2023  PRIMARY CARE PHYSICIAN: Luciana Axe, NP   Patient is coming from: Home  REQUESTING/REFERRING PHYSICIAN: Ward, Layla Maw, DO  CHIEF COMPLAINT:   Chief Complaint  Patient presents with   Hypertension    HISTORY OF PRESENT ILLNESS:  Hannah Dillon is a 89 y.o. Caucasian female with medical history significant for osteoarthritis, cervical radiculopathy, essential hypertension, pulmonary embolism, who presented to the emergency room with acute onset fall a couple of days ago with facial bruises and altered mental status.  Per his son is not been acting himself.  He has been sleeping more than normal for him.  He was having elevated blood pressure with lethargy today.  No chest pain or palpitations.  No cough or wheezing or dyspnea.  No dysuria, oliguria or hematuria or flank pain.  No paresthesias or focal muscle weakness.  ED Course: When the patient came to the ER, BP was 204/86 with otherwise normal vital signs.  Labs revealed BMP revealed hyponatremia 129 hypochloremia of 95 and CO2 of 18, blood glucose 175 and anion gap of 16.  CBC showed macrocytosis.  Urinalysis showed many bacteria with 0-5 WBCs and 5 ketones. EKG as reviewed by me : EKG showed normal sinus rhythm with rate of 72 with left axis deviation. Imaging: Two-view chest x-ray showed bibasal scarring or atelectasis.  Noncontrast head CT scan revealed chronic bilateral subdural hematoma with no midline shift,  measuring about 6 mm.  A superimposed acute component of the right subdural hematoma was noted. C-spine CT showed degenerative disc and facet disease with no acute bony abnormality.  Maxillofacial CT showed no facial or orbital fracture.   The patient was given 5 mg p.o. Norvasc and 20 mg of IV labetalol.  He will be admitted to a progressive unit bed for further evaluation and  management. PAST MEDICAL HISTORY:   Past Medical History:  Diagnosis Date   Arthritis    Cervical radiculopathy    COVID-19 virus infection 07/27/2021   DJD (degenerative joint disease)    Left arm pain     PAST SURGICAL HISTORY:  History reviewed. No pertinent surgical history. No previous surgeries. SOCIAL HISTORY:   Social History   Tobacco Use   Smoking status: Never   Smokeless tobacco: Never  Substance Use Topics   Alcohol use: No    FAMILY HISTORY:   Positive for MI in her father and cancer in her mother.  DRUG ALLERGIES:  No Known Allergies  REVIEW OF SYSTEMS:   ROS As per history of present illness. All pertinent systems were reviewed above. Constitutional, HEENT, cardiovascular, respiratory, GI, GU, musculoskeletal, neuro, psychiatric, endocrine, integumentary and hematologic systems were reviewed and are otherwise negative/unremarkable except for positive findings mentioned above in the HPI.   MEDICATIONS AT HOME:   Prior to Admission medications   Medication Sig Start Date End Date Taking? Authorizing Provider  acetaminophen (TYLENOL) 650 MG CR tablet Take 1,300 mg by mouth every 8 (eight) hours as needed for pain.    [provider]  amLODipine (NORVASC) 5 MG tablet Take 1 tablet (5 mg total) by mouth daily. 08/01/21   Pennie Banter, DO  APIXABAN Everlene Balls) VTE STARTER PACK (10MG  AND 5MG ) Take as directed on package: start with two-5mg  tablets twice daily for 7 days. On day 8, switch to one-5mg  tablet twice  daily. 08/05/21   Danford, Earl Lites, MD  benzonatate (TESSALON) 200 MG capsule Take 1 capsule (200 mg total) by mouth 3 (three) times daily as needed for cough. 07/18/21   Eusebio Friendly B, PA-C  gabapentin (NEURONTIN) 100 MG capsule Take 1 capsule (100 mg total) by mouth 2 (two) times daily. With breakfast and lunch.  Take 2 capsules (200 mg total) at bedtime Patient taking differently: Take 100-200 mg by mouth 2 (two) times daily. With  breakfast and lunch.  Take 2 capsules (200 mg total) at bedtime 07/31/21   Pennie Banter, DO  HYDROcodone-acetaminophen (NORCO) 7.5-325 MG tablet Take 1 tablet by mouth every 8 (eight) hours as needed. 06/15/21   [provider]  ipratropium (ATROVENT) 0.06 % nasal spray Place 2 sprays into both nostrils 4 (four) times daily. 07/18/21   Shirlee Latch, PA-C  morphine (MS CONTIN) 15 MG 12 hr tablet Take 15 mg by mouth at bedtime.    [provider]  naloxone [facility]) nasal spray 4 mg/0.1 mL Place 4 mg into the nose once as needed. 02/23/21   [provider]  XTAMPZA ER 9 MG C12A Take 1 capsule by mouth at bedtime. 06/07/21   [provider]      VITAL SIGNS:  Blood pressure (!) 150/78, pulse 74, temperature 98 F (36.7 C), temperature source Oral, resp. rate 18, height 5' (1.524 m), weight 59 kg, SpO2 99%.  PHYSICAL EXAMINATION:  Physical Exam  GENERAL:  89 y.o.-year-old Caucasian female patient lying in the bed with no acute distress.  EYES: Pupils equal, round, reactive to light and accommodation. No scleral icterus. Extraocular muscles intact.  HEENT: Head with left periorbital and right frontal contusions, normocephalic. Oropharynx and nasopharynx clear.  NECK:  Supple, no jugular venous distention. No thyroid enlargement, no tenderness.  LUNGS: Normal breath sounds bilaterally, no wheezing, rales,rhonchi or crepitation. No use of accessory muscles of respiration.  CARDIOVASCULAR: Regular rate and rhythm, S1, S2 normal. No murmurs, rubs, or gallops.  ABDOMEN: Soft, nondistended, nontender. Bowel sounds present. No organomegaly or mass.  EXTREMITIES: No pedal edema, cyanosis, or clubbing.  NEUROLOGIC: Cranial nerves II through XII are intact. Muscle strength 5/5 in all extremities. Sensation intact. Gait not checked.  PSYCHIATRIC: The patient is alert and oriented x 1 to her name.  Apathetic affect and good eye contact. SKIN: As above.  No obvious other  rash, lesion, or ulcer.   LABORATORY PANEL:   CBC Recent Labs  Lab 10/03/23 2255  WBC 9.6  HGB 13.1  HCT 40.9  PLT 249   ------------------------------------------------------------------------------------------------------------------  Chemistries  Recent Labs  Lab 10/03/23 2300  NA 129*  K 4.7  CL 95*  CO2 18*  GLUCOSE 175*  BUN 22  CREATININE 0.63  CALCIUM 9.0   ------------------------------------------------------------------------------------------------------------------  Cardiac Enzymes No results for input(s): "TROPONINI" in the last 168 hours. ------------------------------------------------------------------------------------------------------------------  RADIOLOGY:  CT HEAD WO CONTRAST ( ) Result Date: 10/04/2023 CLINICAL DATA:  Stand for follow-up subdural hematomas. EXAM: CT HEAD WITHOUT CONTRAST TECHNIQUE: Contiguous axial images were obtained from the base of the skull through the vertex without intravenous contrast. RADIATION DOSE REDUCTION: This exam was performed according to the departmental dose-optimization program which includes automated exposure control, adjustment of the mA and/or kV according to patient size and/or use of iterative reconstruction technique. COMPARISON:  October 03, 2023 FINDINGS: Brain: There is generalized cerebral atrophy with widening of the extra-axial spaces and ventricular dilatation. There are areas of decreased attenuation within the white  matter tracts of the supratentorial brain, consistent with microvascular disease changes. Stable, chronic, approximately 6 mm thick bilateral subdural hematomas versus subdural hygromas are seen. A small amount of superimposed acute blood is noted within the frontotemporal region on the right. This is stable in appearance when compared to the prior exam. Stable subcentimeter foci of increased attenuation are also again seen within the left frontal lobe (axial CT images 18 and 20, CT series  2/sagittal reformatted image 30, CT series 5). There is no evidence of mass effect or midline shift. Vascular: Moderate to marked severity bilateral cavernous carotid artery calcification is noted. Skull: Normal. Negative for fracture or focal lesion. Sinuses/Orbits: No acute finding. Other: None. IMPRESSION: 1. Stable, chronic, approximately 6 mm thick bilateral subdural hematomas versus subdural hygromas. 2. Small amount of superimposed acute blood within the frontotemporal region on the right, stable in appearance when compared to the prior exam. 3. Stable subcentimeter foci of increased attenuation within the left frontal lobe, which may represent small foci of acute hemorrhage. MRI correlation is recommended. 4. Generalized cerebral atrophy and microvascular disease changes of the supratentorial brain. Electronically Signed   By: Aram Candela M.D.   On: 10/04/2023 02:39   CT Maxillofacial Wo Contrast Result Date: 10/04/2023 CLINICAL DATA:  Fall, bruising to left eye and face. EXAM: CT MAXILLOFACIAL WITHOUT CONTRAST TECHNIQUE: Multidetector CT imaging of the maxillofacial structures was performed. Multiplanar CT image reconstructions were also generated. RADIATION DOSE REDUCTION: This exam was performed according to the departmental dose-optimization program which includes automated exposure control, adjustment of the mA and/or kV according to patient size and/or use of iterative reconstruction technique. COMPARISON:  None Available. FINDINGS: Osseous: No fracture or mandibular dislocation. No destructive process. Orbits: Negative. No traumatic or inflammatory finding. Sinuses: Clear Soft tissues: Negative Limited intracranial: See head CT report IMPRESSION: No facial or orbital fracture. Electronically Signed   By: Charlett Nose M.D.   On: 10/04/2023 00:16   CT Cervical Spine Wo Contrast Result Date: 10/04/2023 CLINICAL DATA:  Neck trauma (Age >= 65y). Fall 2 days ago. Bruising to left eye and face.  EXAM: CT CERVICAL SPINE WITHOUT CONTRAST TECHNIQUE: Multidetector CT imaging of the cervical spine was performed without intravenous contrast. Multiplanar CT image reconstructions were also generated. RADIATION DOSE REDUCTION: This exam was performed according to the departmental dose-optimization program which includes automated exposure control, adjustment of the mA and/or kV according to patient size and/or use of iterative reconstruction technique. COMPARISON:  None Available. FINDINGS: Alignment: Normal Skull base and vertebrae: No acute fracture. No primary bone lesion or focal pathologic process. Soft tissues and spinal canal: No prevertebral fluid or swelling. No visible canal hematoma. Disc levels:  Diffuse degenerative disc disease and facet disease. Upper chest: No acute findings Other: None IMPRESSION: Degenerative disc and facet disease.  No acute bony abnormality. Electronically Signed   By: Charlett Nose M.D.   On: 10/04/2023 00:14   DG Chest 2 View Result Date: 10/03/2023 CLINICAL DATA:  Weakness EXAM: CHEST - 2 VIEW COMPARISON:  07/23/2023 FINDINGS: Heart mediastinal contours within normal limits. Minimal bibasilar linear opacities, scarring versus atelectasis. No effusions. No acute bony abnormality. IMPRESSION: Bibasilar scarring or atelectasis. No active disease. Electronically Signed   By: Charlett Nose M.D.   On: 10/03/2023 20:28   CT Head Wo Contrast Result Date: 10/03/2023 CLINICAL DATA:  Head trauma, minor (Age >= 65y).  Fall 2 days ago. EXAM: CT HEAD WITHOUT CONTRAST TECHNIQUE: Contiguous axial images were obtained from the base  of the skull through the vertex without intravenous contrast. RADIATION DOSE REDUCTION: This exam was performed according to the departmental dose-optimization program which includes automated exposure control, adjustment of the mA and/or kV according to patient size and/or use of iterative reconstruction technique. COMPARISON:  10/10/2014 FINDINGS: Brain: There  are low-density subdural fluid collections bilaterally compatible with subdural hygromas or chronic subdural hematomas, 6 mm bilaterally. Small amount of high density subdural blood noted over the right frontotemporal region compatible with an acute component. No intraparenchymal hemorrhage. No hydrocephalus. No midline shift. Vascular: No hyperdense vessel or unexpected calcification. Skull: No acute calvarial abnormality. 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 A superimposed acute component of the right subdural hematoma noted. These results were called by telephone at the time of interpretation on 10/03/2023 at 8:09 pm to provider [facility] , who verbally acknowledged these results. Electronically Signed   By: Charlett Nose M.D.   On: 10/03/2023 20:11      IMPRESSION AND PLAN:  Assessment and Plan: * Subdural hematoma (HCC) - The patient will be admitted to a progressive unit bed. - We will closely follow neurochecks and mental status. - Neurosurgery consult to be obtained. - Dr. Myer Haff was notified about the patient. - Repeated CT scan will be obtained to follow-up. - We will avoid any blood thinners.  He used to be on Eliquis in the past but stopped it.  Hypertensive urgency - The patient will be continued on his antihypertensive therapy. - We will place on as needed IV hydralazine and labetalol. - Will maintain systolic BP below 160.  Peripheral neuropathy - He has cervical radiculopathy. - We will continue Neurontin.  History of pulmonary embolism The patient used to be on Eliquis and quit a while ago.    DVT prophylaxis: SCDs. Advanced Care Planning:  Code Status: The patient is DNR and DNI. Family Communication:  The plan of care was discussed in details with the patient (and family). I answered all questions. The patient agreed to proceed with the above mentioned plan. Further  management will depend upon hospital course. Disposition Plan: Back to previous home environment Consults called: Neurosurgery All the records are reviewed and case discussed with ED provider.  Status is: Inpatient  At the time of the admission, it appears that the appropriate admission status for this patient is inpatient.  This is judged to be reasonable and necessary in order to provide the required intensity of service to ensure the patient's safety given the presenting symptoms, physical exam findings and initial radiographic and laboratory data in the context of comorbid conditions.  The patient requires inpatient status due to high intensity of service, high risk of further deterioration and high frequency of surveillance required.  I certify that at the time of admission, it is my clinical judgment that the patient will require inpatient hospital care extending more than 2 midnights.                            Dispo: The patient is from: Home              Anticipated d/c is to: Home              Patient currently is not medically stable to d/c.              Difficult to place patient: No  Hannah Beat M.D on 10/04/2023  at 4:57 AM  [facility]   From 7 PM-7 AM, contact night-coverage [email or web address]  CC: Primary care physician; Luciana Axe, NP

## 2023-10-04 NOTE — Consult Note (Signed)
 Palliative Care Consult Note                                  Date: 10/04/2023   Patient Name: Hannah Dillon  DOB: 1923/01/06  MRN: [id number]  Age / Sex: 89 y.o., female  PCP: Luciana Axe, NP Referring Physician: Hannah Beat, MD  Reason for Consultation: Establishing goals of care  HPI/Patient Profile: 89 y.o. female  with past medical history of osteoarthritis, cervical radiculopathy, essential hypertension, pulmonary embolism, who presented to the emergency room with acute onset fall a couple of days ago with facial bruises and altered mental status. She was admitted on 10/03/2023 with subdural hematoma, hypertensive urgency, history of PE, and others.  Palliative medicine consulted for GOC conversations.  Past Medical History:  Diagnosis Date   Arthritis    Cervical radiculopathy    COVID-19 virus infection 07/27/2021   DJD (degenerative joint disease)    Left arm pain     Subjective:   This NP Wynne Dust reviewed medical records, received report from team, assessed the patient and then meet at the patient's bedside to discuss diagnosis, prognosis, GOC, EOL wishes disposition and options.  I met with the patient at the bedside, although she does not appear to have capacity.  Present at the bedside was her son [name] who is also her HCPOA.  We excused ourselves to a conference area for additional conversation.   We meet to discuss diagnosis prognosis, GOC, EOL wishes, disposition and options. Concept of Palliative Care was introduced as specialized medical care for people and their families living with serious illness.  If focuses on providing relief from the symptoms and stress of a serious illness.  The goal is to improve quality of life for both the patient and the family. Values and goals of care important to patient and family were attempted to be elicited.  Created space and opportunity for patient  and family to explore  thoughts and feelings regarding current medical situation   Natural trajectory and current clinical status were discussed. Questions and concerns addressed. Patient  encouraged to call with questions or concerns.    Patient/Family Understanding of Illness: [name] understands that his mom has dementia and came in for high blood pressure.  She has an appointment every 3 months with [facility] pain clinic.  She was getting very lethargic yesterday and her head drooping and checked her blood pressure and systolic was between 180 and 190 so he called EMS and brought her to the ER.  He does describe previous episodes of choking/coughing, question possibly aspiration.  He notes that she has recently been falling more lately, multiple times in the past few months.  He knows a CT scan showed a couple old brain bleeds and a current brain bleed.  At baseline the patient has advanced dementia.  She is able to ambulate at home but she does need help getting up and down, getting around.  She also needs assistance with toileting, bathing, dressing.  Her appetite is good generally.  We spent additional time having discussion about her current clinical status and details about her current clinical picture.  Life Review: The patient currently lives with her son.  She had 2 sons but one passed away.  Goals: After discussion, the patient son is interested in speaking with hospice for informational available services.  Today's Discussion: In addition to conversations described above we had extensive discussion  on various topics.  We spent some time detailing the clinical picture.  I shared the overall concern that she has been having frequent falls, apparent decline over the past several months.  I am concerned that she will continue to have falls which may eventually put her in a life-threatening situation.  We discussed that with her age and comorbidities as well as frequent falls and recent decline, advanced dementia,  that she would qualify for hospice.  Currently the patient's son provides all of her care in the home which is pretty remarkable.  We celebrated the wonderful gift that he is giving her allowing her to remain in the home.  I shared that home hospice would be additional support on top of this for a comfort focused approach.  I described hospice as a service for patients who have a life expectancy of 6 months or less. The goal of hospice is the preservation of dignity and quality at the end phases of life. Under hospice care, the focus changes from curative to symptom relief. I explained the three setting where hospice services can be provided including the home, at a living facility (such as LTC SNF, Assisted Living, etc), and a hospice facility. I explained that acceptance to hospice in any specific location is the final decision of the hospice medical director and bed availability, if applicable. They verbalized understanding.  The patient is agreeable for a TOC consult for referral to hospice for further discussions.  He understands this is not a commitment to hospice services.  It seems like he would be open to home hospice being that he currently provides all of his mother's care.  I provided emotional and general support through therapeutic listening, empathy, sharing of stories, and other techniques. I answered all questions and addressed all concerns to the best of my ability.  After my visit I reached out to the medical team, nursing team, [facility] team and discussed visit with the patient and her son as well as his desire for discussion with hospice.  Review of Systems  Unable to perform ROS   Objective:   Primary Diagnoses: Present on Admission:  Subdural hematoma (HCC)  Vascular dementia without behavioral disturbance (HCC)   Physical Exam Vitals and nursing note reviewed.  Constitutional:      General: She is not in acute distress.    Appearance: She is ill-appearing.  HENT:      Head: Normocephalic and atraumatic.  Cardiovascular:     Rate and Rhythm: Normal rate.  Pulmonary:     Effort: Pulmonary effort is normal. No respiratory distress.     Breath sounds: No wheezing or rhonchi.  Abdominal:     General: Abdomen is flat.     Palpations: Abdomen is soft.  Skin:    General: Skin is warm and dry.  Neurological:     Mental Status: She is alert. She is disoriented and confused.  Psychiatric:        Mood and Affect: Mood normal.        Behavior: Behavior normal.     Vital Signs:  BP 120/77   Pulse 69   Temp 98.3 F (36.8 C) (Oral)   Resp 15   Ht 5' (1.524 m)   Wt 59 kg   SpO2 100%   BMI 25.40 kg/m   Palliative Assessment/Data: 50-60%    Advanced Care Planning:   Existing Vynca/ACP Documentation: None  Primary Decision Maker: NEXT OF KIN  Code Status/Advance Care Planning: DNR-limited  A discussion  was had today regarding advanced directives. Concepts specific to code status, artifical feeding and hydration, continued IV antibiotics and rehospitalization was had.  The difference between a aggressive medical intervention path and a palliative comfort care path for this patient at this time was had.   Decisions/Changes to ACP: None today  Assessment & Plan:   Impression: 89 year old female with acute presentation of chronic comorbidities as described above.  The patient has had a recent decline in her health, is falling more frequently.  Currently lives at home with her son who provides her own care.  She now has an SDH, neurologically seems stable.  Given decline in recent increased falls, other comorbidities, son is open to considering hospice care.  Requested [facility] consult for hospice referral.  Palliative medicine will follow-up to facilitate decisions.  Overall prognosis poor.  SUMMARY OF RECOMMENDATIONS   DNR-limited Continue current scope of care TOC consult for referral to hospice for discussions/options My colleague Leeanne Deed,  NP with palliative medicine will follow-up tomorrow for ongoing GOC conversation/decision making Ongoing emotional support of patient and family  Symptom Management:  Per primary team PMT is available to assist as needed  Prognosis:  < 6 months  Discharge Planning:  To Be Determined   Discussed with: Patient's family, medical team, nursing team, [facility] team    Thank you for allowing [place] to participate in the care of Glorine Hanratty PMT will continue to support holistically.  Time Total: 113 min  Detailed review of medical records (labs, imaging, vital signs), medically appropriate exam, discussed with treatment team, counseling and education to patient, family, & staff, documenting clinical information, medication management, coordination of care  Signed by: Wynne Dust, NP Palliative Medicine Team  Team Phone # [phone] (Nights/Weekends)  10/04/2023, 11:34 AM

## 2023-10-05 DIAGNOSIS — R5383 Other fatigue: Secondary | ICD-10-CM | POA: Diagnosis not present

## 2023-10-05 DIAGNOSIS — Z515 Encounter for palliative care: Secondary | ICD-10-CM | POA: Diagnosis not present

## 2023-10-05 DIAGNOSIS — S065XAA Traumatic subdural hemorrhage with loss of consciousness status unknown, initial encounter: Secondary | ICD-10-CM | POA: Diagnosis not present

## 2023-10-05 DIAGNOSIS — F0150 Vascular dementia without behavioral disturbance: Secondary | ICD-10-CM | POA: Diagnosis not present

## 2023-10-05 LAB — BASIC METABOLIC PANEL
Anion gap: 5 (ref 5–15)
BUN: 33 mg/dL — ABNORMAL HIGH (ref 8–23)
CO2: 28 mmol/L (ref 22–32)
Calcium: 8.8 mg/dL — ABNORMAL LOW (ref 8.9–10.3)
Chloride: 93 mmol/L — ABNORMAL LOW (ref 98–111)
Creatinine, Ser: 0.82 mg/dL (ref 0.44–1.00)
GFR, Estimated: >60 mL/min (ref >60)
Glucose, Bld: 117 mg/dL — ABNORMAL HIGH (ref 70–99)
Potassium: 3.6 mmol/L (ref 3.5–5.1)
Sodium: 126 mmol/L — ABNORMAL LOW (ref 135–145)

## 2023-10-05 MED ORDER — BISACODYL 10 MG RE SUPP
10.0000 mg | Freq: Once | RECTAL | Status: DC
  Filled 2023-10-05: qty 1

## 2023-10-05 MED ORDER — LACTULOSE 10 GM/15ML PO SOLN
10.0000 g | Freq: Once | ORAL | Status: DC
  Filled 2023-10-05: qty 30

## 2023-10-05 MED ORDER — LACTULOSE ENEMA
300.0000 mL | Freq: Once | ORAL | Status: DC
  Filled 2023-10-05: qty 300

## 2023-10-05 NOTE — Plan of Care (Signed)
  Problem: Education: Goal: Knowledge of disease or condition will improve Outcome: Progressing Goal: Knowledge of patient specific risk factors will improve (DELETE if not current risk factor) Outcome: Progressing   Problem: Health Behavior/Discharge Planning: Goal: Goals will be collaboratively established with patient/family Outcome: Progressing   Problem: Nutrition: Goal: Dietary intake will improve Outcome: Progressing

## 2023-10-05 NOTE — Progress Notes (Signed)
  Progress Note   Patient: Henri Guedes GNF:[id number] DOB: 1922-10-24 DOA: 10/03/2023     1 DOS: the patient was seen and examined on 10/05/2023   Brief hospital course: No notes on file  Assessment and Plan: * Subdural hematoma (HCC) - Patient's son would like to take mother home in the AM with hospice.   Hyponatremia  Continue salt tablets  Hypertensive urgency - Resolved.  Continue amlodipine and enalapril  - Will maintain systolic BP below 160.  Peripheral neuropathy - He has cervical radiculopathy. - We will continue Neurontin.  History of pulmonary embolism The patient used to be on Eliquis but stopped awhile ago.  H/o Dementia   Chronic bilateral shoulder pain/ chronic pain syndrome On MS ontin and norco 7.5mg /325mg  BID-TID PRN at baseline.  Will continue these Bowel regimen      Subjective: Does not answer questions.   Physical Exam: Vitals:   10/05/23 0005 10/05/23 0440 10/05/23 0734 10/05/23 1134  BP: (!) 128/50 (!) 140/59 (!) 153/128 93/71  Pulse: 80 77 71 68  Resp: 18 16  17   Temp: 98.8 F (37.1 C) 98.1 F (36.7 C) 98 F (36.7 C) 97.9 F (36.6 C)  TempSrc:  Oral  Axillary  SpO2: 95% 94%  93%  Weight:      Height:       Physical Exam  Constitutional: In no distress.  Cardiovascular: Normal rate, regular rhythm. No lower extremity edema  Pulmonary: Non labored breathing on room air, no wheezing or rales.   Abdominal: Soft. Mildly distended  Neurological: Drowsy, easily awakes to voice. Is alert but not oriented.  Skin: Skin is warm and dry.   Data Reviewed:     Latest Ref Rng & Units 10/03/2023   10:55 PM 07/23/2023    7:18 PM 11/19/2021    3:19 AM  CBC  WBC 4.0 - 10.5 K/uL 9.6  7.4  9.6   Hemoglobin 12.0 - 15.0 g/dL 84.6  96.2  95.2   Hematocrit 36.0 - 46.0 % 40.9  38.1  36.8   Platelets 150 - 400 K/uL 249  288  262       Latest Ref Rng & Units 10/05/2023    6:53 AM 10/04/2023    2:46 PM 10/03/2023   11:00 PM  BMP  Glucose 70 - 99  mg/dL 841  324  401   BUN 8 - 23 mg/dL 33  30  22   Creatinine 0.44 - 1.00 mg/dL 0.27  2.53  6.64   Sodium 135 - 145 mmol/L 126  126  129   Potassium 3.5 - 5.1 mmol/L 3.6  3.9  4.7   Chloride 98 - 111 mmol/L 93  90  95   CO2 22 - 32 mmol/L 28  26  18    Calcium 8.9 - 10.3 mg/dL 8.8  9.1  9.0      Family Communication: Spoke with son   Disposition: Status is: Inpatient Remains inpatient appropriate because: patient will discharge home with hopsice   Planned Discharge Destination: Home    Time spent: 35 minutes  Author: Marolyn Haller, MD 10/05/2023 6:58 PM  For on call review [email or web address].

## 2023-10-05 NOTE — Progress Notes (Signed)
 [facility] LIAISON NOTE   Received request from Noni Saupe, RN, Transitions of Care Manager, for hospice services at home after discharge. Spoke with Cheri Guppy, son/POA, to initiate education related to hospice philosophy, services, and team approach to care. Patient/family verbalized understanding of information given. Per discussion, the plan is for discharge home once medically cleared for discharge.  DME needs discussed.  Patient has the following equipment in the home:  transport wheelchair, [facility], 2 wheeled walker Patient/family requests the following equipment for delivery:   hospice admission RN will assess          The address has been verified and is correct in the chart. Please send signed and completed DNR home with patient/family if applicable.  Please provide prescriptions at discharge as needed to ensure ongoing symptom management. [facility] information and contact numbers given to [name], son.  Above information shared with Noni Saupe, RN, Transitions of Care Manager and hospital medical care team.   Please call with any hospice related questions or concerns. Thank you for the opportunity to participate in this patient's care.   Norris Cross, MA, BSN, RN, FNE Nurse Liaison [phone]

## 2023-10-05 NOTE — Progress Notes (Signed)
                                                                                                                                                                                                           Daily Progress Note   Patient Name: Hannah Dillon       Date: 10/05/2023 DOB: 1922-07-19  Age: 89 y.o. MRN#: [id number] Attending Physician: Hannah Haller, MD Primary Care Physician: Hannah Axe, NP Admit Date: 10/03/2023  Reason for Consultation/Follow-up: Establishing goals of care  HPI/Brief Hospital Review: 89 y.o. female  with past medical history of osteoarthritis, cervical radiculopathy, essential hypertension, pulmonary embolism, who presented to the emergency room with acute onset fall a couple of days ago with facial bruises and altered mental status. She was admitted on 10/03/2023 with subdural hematoma, hypertensive urgency, history of PE, and others.   Palliative Medicine consulted for assisting with goals of care conversations.  Subjective: Extensive chart review has been completed prior to meeting patient including labs, vital signs, imaging, progress notes, orders, and available advanced directive documents from current and previous encounters.    Visited with Hannah Dillon at her bedside. She is resting in bed with eyes closed, appears comfortable without obvious signs of discomfort. She is able to acknowledge my presence in room, unable to communicate. Son at bedside during time of visit.  Son confirms conversations had yesterday and again expresses his interest in hospice services at home. He has not yet heard from anyone regarding hospice services. He does not have a preference for hospice agency. Secure chat sent to [facility] to offer choice.  [facility] hospice liaison engaged, Hannah Ee, RN, son has agreed to hospice services in the home, no DME needed. Initial visit scheduled for Monday, recommend continuing current medication regimen as Hannah Dillon tolerating PO without  difficulty and symptoms are being managed.  Answered and addressed all questions and concerns. PMT to continue to follow for ongoing needs and support.  Care plan was discussed with primary team, TOC and hospice liaison  Thank you for allowing the Palliative Medicine Team to assist in the care of this patient.  Total time:  35 minutes  Time spent includes: Detailed review of medical records (labs, imaging, vital signs), medically appropriate exam (mental status, respiratory, cardiac, skin), discussed with treatment team, counseling and educating patient, family and staff, documenting clinical information, medication management and coordination of care.  Hannah Deed, DNP, AGNP-C Palliative Medicine   Please contact Palliative Medicine Team phone at [phone] for questions and concerns.

## 2023-10-05 NOTE — TOC Progression Note (Signed)
 Transition of Care [facility]) - Progression Note    Patient Details  Name: Jonica Bickhart MRN: [id number] Date of Birth: 1922/09/30  Transition of Care [facility]) CM/SW Contact  Bing Quarry, RN Phone Number: 10/05/2023, 11:55 AM  Clinical Narrative:  3/22: Sherron Monday to son regarding choice for hospice referral and and his preference is for [facility]. Let  palliative NP know of preference and will contact hospice liaison for [facility].    Gabriel Cirri MSN RN CM  RN Case Manager [place]  Transitions of Care Direct Dial: [phone] (Weekends Only) [facility] Main Office Phone: [phone] [facility] Fax: [phone] [email or web address]          Expected Discharge Plan and Services                                               Social Determinants of Health (SDOH) Interventions SDOH Screenings   Food Insecurity: No Food Insecurity (10/04/2023)  Housing: Low Risk  (10/04/2023)  Transportation Needs: No Transportation Needs (10/04/2023)  Utilities: Not At Risk (10/04/2023)  Social Connections: Moderately Isolated (10/04/2023)  Tobacco Use: Low Risk  (10/03/2023)    Readmission Risk Interventions     No data to display

## 2023-10-06 ENCOUNTER — Inpatient Hospital Stay

## 2023-10-06 DIAGNOSIS — F0150 Vascular dementia without behavioral disturbance: Secondary | ICD-10-CM | POA: Diagnosis not present

## 2023-10-06 DIAGNOSIS — S065XAA Traumatic subdural hemorrhage with loss of consciousness status unknown, initial encounter: Secondary | ICD-10-CM | POA: Diagnosis not present

## 2023-10-06 DIAGNOSIS — Z515 Encounter for palliative care: Secondary | ICD-10-CM | POA: Diagnosis not present

## 2023-10-06 DIAGNOSIS — R5383 Other fatigue: Secondary | ICD-10-CM | POA: Diagnosis not present

## 2023-10-06 LAB — BASIC METABOLIC PANEL WITH GFR
Anion gap: 6 (ref 5–15)
BUN: 34 mg/dL — ABNORMAL HIGH (ref 8–23)
CO2: 27 mmol/L (ref 22–32)
Calcium: 8.8 mg/dL — ABNORMAL LOW (ref 8.9–10.3)
Chloride: 96 mmol/L — ABNORMAL LOW (ref 98–111)
Creatinine, Ser: 0.61 mg/dL (ref 0.44–1.00)
GFR, Estimated: >60 mL/min
Glucose, Bld: 178 mg/dL — ABNORMAL HIGH (ref 70–99)
Potassium: 3.5 mmol/L (ref 3.5–5.1)
Sodium: 129 mmol/L — ABNORMAL LOW (ref 135–145)

## 2023-10-06 MED ORDER — LABETALOL HCL 5 MG/ML IV SOLN
20.0000 mg | INTRAVENOUS | Status: DC | PRN
  Administered 2023-10-06: 20 mg via INTRAVENOUS
  Filled 2023-10-06: qty 4

## 2023-10-06 MED ORDER — SENNOSIDES-DOCUSATE SODIUM 8.6-50 MG PO TABS: 1.0000 | ORAL_TABLET | Freq: Two times a day (BID) | ORAL | 0 refills | Status: DC

## 2023-10-06 MED ORDER — SODIUM CHLORIDE 1 G PO TABS: 2.0000 g | ORAL_TABLET | Freq: Three times a day (TID) | ORAL | 0 refills | Status: DC

## 2023-10-06 MED ORDER — MILK AND MOLASSES ENEMA
1.0000 | Freq: Once | RECTAL | Status: AC
  Administered 2023-10-06: 240 mL via RECTAL
  Filled 2023-10-06: qty 240

## 2023-10-06 MED ORDER — PANTOPRAZOLE SODIUM 40 MG PO TBEC
40.0000 mg | DELAYED_RELEASE_TABLET | Freq: Every day | ORAL | Status: DC
  Administered 2023-10-06: 40 mg via ORAL
  Filled 2023-10-06 (×2): qty 1

## 2023-10-06 MED ORDER — TRAZODONE HCL 50 MG PO TABS: 25.0000 mg | ORAL_TABLET | Freq: Every evening | ORAL | 0 refills | Status: DC | PRN

## 2023-10-06 NOTE — Progress Notes (Addendum)
 Progress Note   Patient: Hannah Dillon [id number]:[id number] DOB: 1923/02/24 DOA: 10/03/2023     2 DOS: the patient was seen and examined on 10/06/2023   Brief hospital course: From H and P   Indira Sorenson is a 89 y.o. female with medical history significant for osteoarthritis, cervical radiculopathy, essential hypertension, and pulmonary embolism, who presented to the emergency room with fall a couple of days ago with facial bruises and altered mental status.  Per her son she has not been acting like herself and sleeping more than normal. Her blood pressure was also more elevated. She had no complaints of chest pain or palpitations.  No cough or wheezing or dyspnea.  No dysuria, oliguria or hematuria or flank pain.  No paresthesias or focal muscle weakness.    Patient was found to have chronic bilateral SDH with no midline shift with a superimposed acute component of the R SDH. Neurosurgery was consulted and recommended follow up imaging and control of her blood pressure. Palliative was consulted and patient's son eventually decided to transition her to hospice.    Assessment and Plan: * Subdural hematoma [facility]) - Patient's son has transitioned mother to hospice. He has concerns regarding his ability to care for his mother at home in her current state. Will reach out to palliative care team and hospice team to see if they can speak with Mr. Tyler Deis.   Hyponatremia  Continue salt tablets  Hypertensive urgency - Resolved.  - Will maintain systolic BP below 160. Continue amlodipine and enalapril    Peripheral neuropathy - She has cervical radiculopathy. - Continue Neurontin.  History of pulmonary embolism The patient used to be on Eliquis but stopped awhile ago.  H/o Dementia   Chronic bilateral shoulder pain/ chronic pain syndrome On MS ontin and norco 7.5mg /325mg  BID-TID PRN at baseline.  Will continue these Bowel regimen   Ileus No N/V. Had BM yesterday. In the setting of chronic  opioid use and recent TBI.  -Continue senna, will order enema as well      Subjective: States name. Otherwise not able to carry coherent conversation   Physical Exam: Vitals:   10/05/23 0734 10/05/23 1134 10/05/23 1941 10/06/23 0754  BP: (!) 153/128 93/71 (!) 140/59 (!) 169/114  Pulse: 71 68 71 81  Resp:  17 18 15   Temp: 98 F (36.7 C) 97.9 F (36.6 C) 98.2 F (36.8 C) 97.9 F (36.6 C)  TempSrc:  Axillary Axillary   SpO2:  93% 94% 95%  Weight:      Height:        Constitutional: In no distress.  Cardiovascular: Normal rate, regular rhythm. No lower extremity edema  Pulmonary: Non labored breathing on room air, no wheezing or rales.   Abdominal: Soft. Distended Musculoskeletal: Normal range of motion.     Neurological: Alert and oriented to person only  Skin: Skin is warm and dry.   Data Reviewed:     Latest Ref Rng & Units 10/03/2023   10:55 PM 07/23/2023    7:18 PM 11/19/2021    3:19 AM  CBC  WBC 4.0 - 10.5 K/uL 9.6  7.4  9.6   Hemoglobin 12.0 - 15.0 g/dL 46.9  62.9  52.8   Hematocrit 36.0 - 46.0 % 40.9  38.1  36.8   Platelets 150 - 400 K/uL 249  288  262       Latest Ref Rng & Units 10/06/2023    4:59 AM 10/05/2023    6:53 AM 10/04/2023  2:46 PM  BMP  Glucose 70 - 99 mg/dL 161  096  045   BUN 8 - 23 mg/dL 34  33  30   Creatinine 0.44 - 1.00 mg/dL 4.09  8.11  9.14   Sodium 135 - 145 mmol/L 129  126  126   Potassium 3.5 - 5.1 mmol/L 3.5  3.6  3.9   Chloride 98 - 111 mmol/L 96  93  90   CO2 22 - 32 mmol/L 27  28  26    Calcium 8.9 - 10.3 mg/dL 8.8  8.8  9.1      Family Communication: Spoke with son   Disposition: Status is: Inpatient Remains inpatient appropriate because: patient will discharge home with hopsice   Planned Discharge Destination: Home    Time spent: 35 minutes  Author: Marolyn Haller, MD 10/06/2023 9:57 AM  For on call review [email or web address].

## 2023-10-06 NOTE — Plan of Care (Signed)
  Problem: Intracerebral Hemorrhage Tissue Perfusion: Goal: Complications of Intracerebral Hemorrhage will be minimized Outcome: Progressing   Problem: Coping: Goal: Will verbalize positive feelings about self 10/06/2023 0557 by Jennelle Human, RN Outcome: Progressing 10/06/2023 0551 by Jennelle Human, RN Outcome: Progressing   Problem: Health Behavior/Discharge Planning: Goal: Ability to manage health-related needs will improve Outcome: Progressing   Problem: Self-Care: Goal: Ability to participate in self-care as condition permits will improve Outcome: Progressing   Problem: Education: Goal: Knowledge of disease or condition will improve 10/06/2023 0557 by Jennelle Human, RN Outcome: Not Progressing 10/06/2023 0551 by Jennelle Human, RN Outcome: Progressing Goal: Knowledge of secondary prevention will improve (MUST DOCUMENT ALL) Outcome: Not Progressing Goal: Knowledge of patient specific risk factors will improve (DELETE if not current risk factor) 10/06/2023 0557 by Jennelle Human, RN Outcome: Not Progressing 10/06/2023 0551 by Jennelle Human, RN Outcome: Progressing

## 2023-10-06 NOTE — Plan of Care (Signed)
  Problem: Education: Goal: Knowledge of disease or condition will improve Outcome: Progressing Goal: Knowledge of patient specific risk factors will improve (DELETE if not current risk factor) Outcome: Progressing   Problem: Coping: Goal: Will verbalize positive feelings about self Outcome: Progressing   Problem: Self-Care: Goal: Ability to participate in self-care as condition permits will improve Outcome: Progressing

## 2023-10-06 NOTE — Hospital Course (Signed)
 Awake and alert.able to state name.   Abdomen distended. Will check KUB. Place order for milk molasses enema

## 2023-10-06 NOTE — Progress Notes (Signed)
 Per Dr Montez Morita, dc NIH/stroke orders

## 2023-10-06 NOTE — Progress Notes (Signed)
 Sports coach Note  Met at bedside with son, [name].  Discussed discharge plans again and reviewed DME.  He would like a hospital bed now.  DME- hospital bed and overbed table will be ordered with anticipated delivery tomorrow.  [name] will call hospital liaison line to let us know when DME is delivered.  Anticipate hospital d/c tomorrow after DME delivered.  Hospital medical team and Noni Saupe, RN Transitions of Care notified of above plan.  Please do not hesitate to call with hospice related questions or concerns.  Norris Cross, RN Nurse Liaison [phone]

## 2023-10-06 NOTE — Progress Notes (Signed)
 Daily Progress Note   Patient Name: Hannah Dillon       Date: 10/06/2023 DOB: [birth year]  Age: 89 y.o. MRN#: [id number] Attending Physician: Marolyn Haller, MD Primary Care Physician: Luciana Axe, NP Admit Date: 10/03/2023  Reason for Consultation/Follow-up: Establishing goals of care  HPI/Brief Hospital Review: 89 y.o. female  with past medical history of osteoarthritis, cervical radiculopathy, essential hypertension, pulmonary embolism, who presented to the emergency room with acute onset fall a couple of days ago with facial bruises and altered mental status. She was admitted on 10/03/2023 with subdural hematoma, hypertensive urgency, history of PE, and others.    Palliative Medicine consulted for assisting with goals of care conversations.  Subjective: Extensive chart review has been completed prior to meeting patient including labs, vital signs, imaging, progress notes, orders, and available advanced directive documents from current and previous encounters.    Visited with Hannah Dillon at her bedside. She is resting in bed, acknowledges my presence in room, able to respond to calling of her name, limited communication. Son at bedside during time of visit.  Son shares he was able to feed Hannah Dillon several bites of her breakfast. Shares she has been resting the majority of the time he has been visiting.  [name] expresses concerns related to discharging home with hospice services. We discussed alternative options such as LTC placement with hospice following, [name] shares he is not able to afford LTC costs and does not feel Hannah Dillon would qualify for hospice. We discussed the role of hospice in the home and education/supplies provided by hospice to assist with hygenic needs. We  also discussed possibility of hiring private care aids for the few hours he needs to step away to run errands or mow the lawn. Also discussed DME needs and recommended discharge to be held until hospital bed can be delivered as Hannah Dillon requires total care assistance at this point. We also discussed meal preparation and types of foods (soft and minced) that would be appropriate for Hannah Dillon.  Shared Hannah Dillon will also require EMS transport home as [name] is unable to safely transport her home at this time.  [name] shares he is now feels more comfortable and agrees to home with hospice.  Shared conversation with primary team, TOC and hospice liaison.  Answered and addressed all questions  and concerns. PMT to step away from daily visits as goals/plans are set, please reengage if needs/concerns arise.  Thank you for allowing the Palliative Medicine Team to assist in the care of this patient.  Total time:  50 minutes  Time spent includes: Detailed review of medical records (labs, imaging, vital signs), medically appropriate exam (mental status, respiratory, cardiac, skin), discussed with treatment team, counseling and educating patient, family and staff, documenting clinical information, medication management and coordination of care.  Leeanne Deed, DNP, AGNP-C Palliative Medicine   Please contact Palliative Medicine Team phone at [phone] for questions and concerns.

## 2023-10-06 NOTE — Progress Notes (Signed)
 PHARMACIST - PHYSICIAN COMMUNICATION  DR:   Montez Morita  CONCERNING: IV to Oral Route Change Policy  RECOMMENDATION: This patient is receiving pantoprazole by the intravenous route.  Based on criteria approved by the Pharmacy and Therapeutics Committee, the intravenous medication(s) is/are being converted to the equivalent oral dose form(s).   DESCRIPTION: These criteria include: The patient is eating (either orally or via tube) and/or has been taking other orally administered medications for a least 24 hours The patient has no evidence of active gastrointestinal bleeding or impaired GI absorption (gastrectomy, short bowel, patient on TNA or NPO).  Hannah Dillon PharmD, BCPS 10/06/2023 3:41 PM

## 2023-10-06 NOTE — Progress Notes (Signed)
 Patient abdomen very distended, checked patient she had BM but no urine. I message Dr. Montez Morita for bladder scan to determine if she is retaining. Bladder scan resulted 225cc in bladder. Results documented and messaged to Dr. Montez Morita via secure chat.

## 2023-10-06 NOTE — Progress Notes (Signed)
 Milk and Molasses enema given, no immediate results of bowel movement. Dr. Montez Morita via secure chat.

## 2023-10-06 NOTE — Discharge Instructions (Signed)
 Please ensure patient takes senna daily with her opioids. She may also require occasional enema to assist with stooling because of her chronic opioid use.

## 2023-10-07 DIAGNOSIS — Z86711 Personal history of pulmonary embolism: Secondary | ICD-10-CM | POA: Diagnosis not present

## 2023-10-07 DIAGNOSIS — F0150 Vascular dementia without behavioral disturbance: Secondary | ICD-10-CM | POA: Diagnosis not present

## 2023-10-07 DIAGNOSIS — I160 Hypertensive urgency: Secondary | ICD-10-CM | POA: Diagnosis not present

## 2023-10-07 DIAGNOSIS — S065XAA Traumatic subdural hemorrhage with loss of consciousness status unknown, initial encounter: Secondary | ICD-10-CM | POA: Diagnosis not present

## 2023-10-07 DIAGNOSIS — Z515 Encounter for palliative care: Secondary | ICD-10-CM

## 2023-10-07 LAB — BASIC METABOLIC PANEL
Anion gap: 16 — ABNORMAL HIGH (ref 5–15)
BUN: 46 mg/dL — ABNORMAL HIGH (ref 8–23)
CO2: 24 mmol/L (ref 22–32)
Calcium: 9.6 mg/dL (ref 8.9–10.3)
Chloride: 96 mmol/L — ABNORMAL LOW (ref 98–111)
Creatinine, Ser: 0.64 mg/dL (ref 0.44–1.00)
GFR, Estimated: >60 mL/min (ref >60)
Glucose, Bld: 157 mg/dL — ABNORMAL HIGH (ref 70–99)
Potassium: 3.5 mmol/L (ref 3.5–5.1)
Sodium: 135 mmol/L (ref 135–145)

## 2023-10-07 MED ORDER — POLYETHYLENE GLYCOL 3350 17 G PO PACK
17.0000 g | PACK | Freq: Every day | ORAL | Status: DC
  Administered 2023-10-07 – 2023-10-08 (×2): 17 g via ORAL
  Filled 2023-10-07 (×2): qty 1

## 2023-10-07 NOTE — Plan of Care (Signed)
 Pt has increased lethargy and is less responsive than last night.  Now has foley w/little output.  Didn't feel comfortable w/giving pt meds.  Asked charge to assess pt too and she agreed.  She does not appear uncomfortable and will continue to monitor.

## 2023-10-07 NOTE — TOC Initial Note (Signed)
 Transition of Care [facility]) - Initial/Assessment Note    Patient Details  Name: Hannah Dillon MRN: [id number] Date of Birth: Oct 12, 1922  Transition of Care [facility]) CM/SW Contact:    Cherre Blanc, RN Phone Number: 10/07/2023, 10:15 AM  Clinical Narrative:                 Patient with advanced dementia and is unable to answer questions. TOC called patient's son [phone] but did not receive a callback. TOC reviewed patient's chart and the son is deciding on whether he would like hospice involvement. TOC will continue to outreach and follow for dc planning.  Expected Discharge Plan: Hospice Medical Facility Barriers to Discharge: SNF Pending bed offer   Patient Goals and CMS Choice            Expected Discharge Plan and Services   Discharge Planning Services: CM Consult Post Acute Care Choice: Hospice, Skilled Nursing Facility Living arrangements for the past 2 months: Single Family Home Expected Discharge Date: 10/06/23                                    Prior Living Arrangements/Services Living arrangements for the past 2 months: Single Family Home Lives with:: Adult Children              Current home services: DME    Activities of Daily Living   ADL Screening (condition at time of admission) Independently performs ADLs?: No Does the patient have a NEW difficulty with bathing/dressing/toileting/self-feeding that is expected to last >3 days?: No Does the patient have a NEW difficulty with getting in/out of bed, walking, or climbing stairs that is expected to last >3 days?: No Does the patient have a NEW difficulty with communication that is expected to last >3 days?: No Is the patient deaf or have difficulty hearing?: No Does the patient have difficulty seeing, even when wearing glasses/contacts?: No Does the patient have difficulty concentrating, remembering, or making decisions?: Yes  Permission Sought/Granted                  Emotional  Assessment Appearance:: Appears stated age         Psych Involvement: No (comment)  Admission diagnosis:  Subdural hematoma (HCC) [S06.5XAA] Metabolic acidosis [E87.20] Lethargic [R53.83] Fall, initial encounter [W19.XXXA] Patient Active Problem List   Diagnosis Date Noted   Subdural hematoma (HCC) 10/04/2023   Hypertensive urgency 10/04/2023   Peripheral neuropathy 10/04/2023   History of pulmonary embolism 10/04/2023   Lung infiltrate 08/04/2021   Pulmonary embolism (HCC) 08/02/2021   Generalized weakness 07/30/2021   Chronic pain 07/30/2021   Hypertension 07/30/2021   Degenerative joint disease    Cervical radiculopathy    Vascular dementia without behavioral disturbance (HCC) 03/11/2019   PCP:  Luciana Axe, NP Pharmacy:   [facility] [address] [place] - [address] [place] [address] Phone: [phone] Fax: [phone]  [id number] [place] - [address] [facility] [address] AT [facility] [address] [facility] [place] [address] Phone: [phone] Fax: [phone]     Social Drivers of Health (SDOH) Social History: SDOH Screenings   Food Insecurity: No Food Insecurity (10/04/2023)  Housing: Low Risk  (10/04/2023)  Transportation Needs: No Transportation Needs (10/04/2023)  Utilities: Not At Risk (10/04/2023)  Social Connections: Moderately Isolated (10/04/2023)  Tobacco Use: Low Risk  (  10/03/2023)   SDOH Interventions:     Readmission Risk Interventions     No data to display

## 2023-10-07 NOTE — Progress Notes (Signed)
 Progress Note   Patient: Hannah Dillon ZOX:[id number] DOB: [birth year] DOA: 10/03/2023     3 DOS: the patient was seen and examined on 10/07/2023   Brief hospital course: Hannah Dillon is a 89 y.o. female with medical history significant for osteoarthritis, cervical radiculopathy, essential hypertension, and pulmonary embolism, who presented to the emergency room with fall a couple of days ago with facial bruises and altered mental status.  Per her son she has not been acting like herself and sleeping more than normal. Her blood pressure was also more elevated.     Patient was found to have chronic bilateral SDH with no midline shift with a superimposed acute component of the R SDH. Neurosurgery was consulted and recommended follow up imaging and control of her blood pressure. Palliative was consulted and patient's son eventually decided to transition her to hospice. TOC working on home hospice.  Assessment and Plan: * Subdural hematoma (HCC) Patient's family opted for hospice care only. Palliative team follow-up appreciated. TOC working on home with hospice. DME to be delivered today. Discharge plan tomorrow.  Hypertensive urgency Continue home antihypertensive medications. Hydralazine IV as needed for elevated blood pressures.  Peripheral neuropathy Continue home dose Neurontin.  Hyponatremia Can do salt tablets while in the hospital.  History of pulmonary embolism Patient's Eliquis is stopped a while ago.  History of dementia- Supportive care.  Ileus Abdominal distention She has good bowel movements.  Continue constipation regimen. Possible urinary retention.  RN advised to place Foley for comfort measures.  Chronic pain syndrome Chronic bilateral shoulder pain Continue home MS Contin, Norco as needed for now Continue constipation regimen.     Out of bed to chair. Incentive spirometry. Nursing supportive care. Fall, aspiration precautions. Diet:  Diet Orders  (From admission, onward)     Start     Ordered   10/04/23 0925  DIET DYS 3 Room service appropriate? No; Fluid consistency: Thin; Fluid restriction: 1500 mL Fluid  Diet effective now       Comments: MINCED Meats w/ Gravies.  Yogurt, puddings.  Question Answer Comment  Room service appropriate? No   Fluid consistency: Thin   Fluid restriction: 1500 mL Fluid      10/04/23 0925           DVT prophylaxis: Place and maintain sequential compression device Start: 10/04/23 0250 SCD's Start: 10/04/23 0118  Level of care: med-surg   Code Status: Limited: Do not attempt resuscitation (DNR) -DNR-LIMITED -Do Not Intubate/DNI   Subjective: Patient is seen and examined today morning.  She is sleeping comfortably, opens eyes upon calling.  Her belly is distended, had a bowel movement yesterday, possible urinary retention per RN.  Physical Exam: Vitals:   10/07/23 0534 10/07/23 0836 10/07/23 0837 10/07/23 1519  BP: (!) 140/84  (!) 161/65 (!) 148/82  Pulse: 72  77 91  Resp:   20 16  Temp: 98.4 F (36.9 C) 98.4 F (36.9 C)  99.3 F (37.4 C)  TempSrc: Oral Oral    SpO2: 94%  95% 95%  Weight:      Height:        General - Elderly ill Caucasian female, sleeping in no distress HEENT - PERRLA, EOMI, atraumatic head, non tender sinuses. Lung - Clear, basal rales, rhonchi, wheezes. Heart - S1, S2 heard, no murmurs, rubs, no pedal edema. Abdomen - Soft, non tender, distended, bowel sounds poor Neuro - Alert, does not follow commands , non focal exam. Skin - Warm and dry.  Data Reviewed:  Latest Ref Rng & Units 10/03/2023   10:55 PM 07/23/2023    7:18 PM 11/19/2021    3:19 AM  CBC  WBC 4.0 - 10.5 K/uL 9.6  7.4  9.6   Hemoglobin 12.0 - 15.0 g/dL 21.3  08.6  57.8   Hematocrit 36.0 - 46.0 % 40.9  38.1  36.8   Platelets 150 - 400 K/uL 249  288  262       Latest Ref Rng & Units 10/07/2023    3:18 AM 10/06/2023    4:59 AM 10/05/2023    6:53 AM  BMP  Glucose 70 - 99 mg/dL 469  629  528    BUN 8 - 23 mg/dL 46  34  33   Creatinine 0.44 - 1.00 mg/dL 4.13  2.44  0.10   Sodium 135 - 145 mmol/L 135  129  126   Potassium 3.5 - 5.1 mmol/L 3.5  3.5  3.6   Chloride 98 - 111 mmol/L 96  96  93   CO2 22 - 32 mmol/L 24  27  28    Calcium 8.9 - 10.3 mg/dL 9.6  8.8  8.8    DG Abd 1 View Result Date: 10/06/2023 CLINICAL DATA:  Abdominal distention EXAM: ABDOMEN - 1 VIEW COMPARISON:  CT abdomen and pelvis dated 11/19/2021 FINDINGS: Marked gas-filled dilation of bowel loops throughout the abdomen. No pneumatosis. No abnormal radio-opaque calculi or mass effect. No acute or substantial osseous abnormality. The sacrum and coccyx are partially obscured by overlying bowel contents. IMPRESSION: Marked gas-filled dilation of bowel loops throughout the abdomen, which may represent ileus or bowel obstruction. Electronically Signed   By: Agustin Cree M.D.   On: 10/06/2023 09:13    Family Communication: TOC discussed with son regarding discharge plan. All questions answered.  Disposition: Status is: Inpatient Remains inpatient appropriate because: hospice care  Planned Discharge Destination:  Home with hospice     Time spent: 39 minutes  Author: Marcelino Duster, MD 10/07/2023 4:54 PM Secure chat 7am to 7pm For on call review [email or web address].

## 2023-10-07 NOTE — Progress Notes (Signed)
 PT Cancellation Note  Patient Details Name: Hannah Dillon MRN: [id number] DOB: 1923-01-23   Cancelled Treatment:    Reason Eval/Treat Not Completed: Other (comment).  Pt currently busy with nursing care.  Will re-attempt PT session at a later date/time.  Hendricks Limes, PT 10/07/23, 4:55 PM

## 2023-10-07 NOTE — Care Management Important Message (Signed)
 Important Message  Patient Details  Name: Hannah Dillon MRN: [id number] Date of Birth: 06/23/1923   Important Message Given:  Yes - Medicare IM     Cristela Blue, CMA 10/07/2023, 12:41 PM

## 2023-10-07 NOTE — Progress Notes (Addendum)
 [facility] Liaison Note  Patient's son, [name], has a dental appointment this afternoon and requested hospital discharge tomorrow morning. DME to be delivered today.   Per Dr. Clide Dales, ok for discharge tomorrow. Requesting mid-morning discharge so patient will be home in time for afternoon admission to hospice at 1pm.  Please send completed and signed DNR with patient at discharge.  Please provide prescriptions as needed to ensure ongoing symptom management.  Please call with any hospice related questions or concerns.  Thank you, Haynes Bast, BSN, [facility] Liaison [phone]

## 2023-10-08 DIAGNOSIS — I160 Hypertensive urgency: Secondary | ICD-10-CM | POA: Diagnosis not present

## 2023-10-08 DIAGNOSIS — G6289 Other specified polyneuropathies: Secondary | ICD-10-CM

## 2023-10-08 DIAGNOSIS — Z86711 Personal history of pulmonary embolism: Secondary | ICD-10-CM | POA: Diagnosis not present

## 2023-10-08 DIAGNOSIS — K567 Ileus, unspecified: Secondary | ICD-10-CM

## 2023-10-08 DIAGNOSIS — S065XAA Traumatic subdural hemorrhage with loss of consciousness status unknown, initial encounter: Secondary | ICD-10-CM | POA: Diagnosis not present

## 2023-10-08 MED ORDER — LIDOCAINE 5 % EX PTCH
1.0000 | MEDICATED_PATCH | Freq: Two times a day (BID) | CUTANEOUS | 0 refills | Status: DC
Start: 2023-10-08 — End: 2024-01-15

## 2023-10-08 MED ORDER — SENNOSIDES-DOCUSATE SODIUM 8.6-50 MG PO TABS
1.0000 | ORAL_TABLET | Freq: Two times a day (BID) | ORAL | 0 refills | Status: DC
Start: 2023-10-08 — End: 2024-01-15

## 2023-10-08 MED ORDER — POLYETHYLENE GLYCOL 3350 17 G PO PACK: 17.0000 g | PACK | Freq: Every day | ORAL | 0 refills | Status: DC

## 2023-10-08 MED ORDER — HYDROCODONE-ACETAMINOPHEN 7.5-325 MG PO TABS: 1.0000 | ORAL_TABLET | Freq: Three times a day (TID) | ORAL | 0 refills | Status: DC | PRN

## 2023-10-08 MED ORDER — LORAZEPAM 0.5 MG PO TABS: 0.5000 mg | ORAL_TABLET | ORAL | 0 refills | Status: DC | PRN

## 2023-10-08 MED ORDER — TRAZODONE HCL 50 MG PO TABS: 25.0000 mg | ORAL_TABLET | Freq: Every evening | ORAL | 0 refills | Status: DC | PRN

## 2023-10-08 MED ORDER — ONDANSETRON 4 MG PO TBDP: 4.0000 mg | ORAL_TABLET | Freq: Three times a day (TID) | ORAL | 0 refills | Status: DC | PRN

## 2023-10-08 MED ORDER — CHLORHEXIDINE GLUCONATE CLOTH 2 % EX PADS
6.0000 | MEDICATED_PAD | Freq: Every day | CUTANEOUS | Status: DC
Start: 2023-10-08 — End: 2023-10-08
  Administered 2023-10-08: 6 via TOPICAL

## 2023-10-08 MED ORDER — MORPHINE SULFATE ER 15 MG PO TBCR: 15.0000 mg | EXTENDED_RELEASE_TABLET | Freq: Every day | ORAL | 0 refills | Status: DC

## 2023-10-08 MED ORDER — LORAZEPAM 2 MG/ML IJ SOLN
0.5000 mg | Freq: Once | INTRAMUSCULAR | Status: AC
  Administered 2023-10-08: 0.5 mg via INTRAVENOUS
  Filled 2023-10-08: qty 1

## 2023-10-08 NOTE — Discharge Summary (Signed)
 Physician Discharge Summary   Patient: Hannah Dillon MRN: [id number] DOB: 05/24/1923  Admit date:     10/03/2023  Discharge date: 10/08/23  Discharge Physician: Marcelino Duster   PCP: Luciana Axe, NP   Recommendations at discharge:    Home with hospice care only.  Discharge Diagnoses: Principal Problem:   Subdural hematoma (HCC) Active Problems:   Hypertensive urgency   Peripheral neuropathy   Vascular dementia without behavioral disturbance (HCC)   History of pulmonary embolism  Resolved Problems:   * No resolved hospital problems. *  Hospital Course: Hannah Dillon is a 89 y.o. female with medical history significant for osteoarthritis, cervical radiculopathy, essential hypertension, and pulmonary embolism, who presented to the emergency room with fall a couple of days ago with facial bruises and altered mental status.  Per her son she has not been acting like herself and sleeping more than normal. Her blood pressure was also more elevated.     Patient was found to have chronic bilateral SDH with no midline shift with a superimposed acute component of the R SDH. Neurosurgery was consulted and recommended follow up imaging and control of her blood pressure. Palliative was consulted and patient's son eventually decided to transition her to hospice. Patient to be discharged home with hospice.  Assessment and Plan: * Subdural hematoma (HCC) Patient's family opted for hospice care only. Palliative team follow-up appreciated. TOC worked on DME, hospice services. DME arranged. All med scripts printed and signed.   Hypertensive urgency Stopped all antihypertensive meds.   Peripheral neuropathy Continue home dose Neurontin.   Hyponatremia Stopped salt tabs.   History of pulmonary embolism Patient's Eliquis is stopped a while ago.   History of dementia- Supportive care.   Ileus Abdominal distention She has good bowel movements.  Continue constipation  regimen. Continue Foley care for comfort.   Chronic pain syndrome Chronic bilateral shoulder pain Continue home MS Contin, Norco as needed for now Continue constipation regimen. Ativan as needed scripts printed.          Consultants: Neurosurgery, vascular surgery Procedures performed: none  Disposition: Hospice care Diet recommendation:  Discharge Diet Orders (From admission, onward)     Start     Ordered   10/08/23 0000  Diet - low sodium heart healthy        10/08/23 0932           Regular diet DISCHARGE MEDICATION: Allergies as of 10/08/2023   No Known Allergies      Medication List     STOP taking these medications    amLODipine 5 MG tablet Commonly known as: NORVASC       TAKE these medications    acetaminophen 650 MG CR tablet Commonly known as: TYLENOL Take 1,300 mg by mouth every 8 (eight) hours as needed for pain.   gabapentin 300 MG capsule Commonly known as: NEURONTIN Take 300 mg by mouth 3 (three) times daily.   HYDROcodone-acetaminophen 7.5-325 MG tablet Commonly known as: NORCO Take 1 tablet by mouth every 8 (eight) hours as needed.   lidocaine 5 % Commonly known as: Lidoderm Place 1 patch onto the skin every 12 (twelve) hours. Remove & Discard patch within 12 hours or as directed by MD   LORazepam 0.5 MG tablet Commonly known as: ATIVAN Take 1 tablet (0.5 mg total) by mouth every 4 (four) hours as needed for anxiety. May crush, mix with water and give sublingually if needed.   morphine 15 MG 12 hr tablet Commonly known as:  MS CONTIN Take 1 tablet (15 mg total) by mouth at bedtime. What changed: Another medication with the same name was removed. Continue taking this medication, and follow the directions you see here.   naloxone 4 MG/0.1ML Liqd nasal spray kit Commonly known as: NARCAN Place 0.4 mg into the nose once.   ondansetron 4 MG disintegrating tablet Commonly known as: ZOFRAN-ODT Take 1 tablet (4 mg total) by mouth  every 8 (eight) hours as needed for nausea or vomiting.   polyethylene glycol 17 g packet Commonly known as: MIRALAX / GLYCOLAX Take 17 g by mouth daily. Start taking on: October 09, 2023   senna-docusate 8.6-50 MG tablet Commonly known as: Senokot-S Take 1 tablet by mouth 2 (two) times daily.   traZODone 50 MG tablet Commonly known as: DESYREL Take 0.5 tablets (25 mg total) by mouth at bedtime as needed for sleep.        Follow-up Information     Luciana Axe, NP Follow up.   Specialty: Family Medicine Why: Hospital follow up Contact information: [address] [place] [address] [phone]                Discharge Exam: Ceasar Mons Weights   10/03/23 1932  Weight: 59 kg      10/08/2023    8:55 AM 10/07/2023    7:26 PM 10/07/2023    3:19 PM  Vitals with BMI  Systolic 160 140 914  Diastolic 69 69 82  Pulse 101 86 91    General - Elderly ill Caucasian female, in distress due to pain HEENT - PERRLA, EOMI, atraumatic head, non tender sinuses. Lung - Clear, basal rales, rhonchi, wheezes. Heart - S1, S2 heard, no murmurs, rubs, no pedal edema. Abdomen - Soft, non tender, distended, bowel sounds poor Neuro - Alert, does not follow commands , non focal exam. Skin - Warm and dry.  Condition at discharge: poor  The results of significant diagnostics from this hospitalization (including imaging, microbiology, ancillary and laboratory) are listed below for reference.   Imaging Studies: DG Abd 1 View Result Date: 10/06/2023 CLINICAL DATA:  Abdominal distention EXAM: ABDOMEN - 1 VIEW COMPARISON:  CT abdomen and pelvis dated 11/19/2021 FINDINGS: Marked gas-filled dilation of bowel loops throughout the abdomen. No pneumatosis. No abnormal radio-opaque calculi or mass effect. No acute or substantial osseous abnormality. The sacrum and coccyx are partially obscured by overlying bowel contents. IMPRESSION: Marked gas-filled dilation of bowel loops throughout the  abdomen, which may represent ileus or bowel obstruction. Electronically Signed   By: Agustin Cree M.D.   On: 10/06/2023 09:13   DG Tibia/Fibula Right Result Date: 10/04/2023 CLINICAL DATA:  Multiple falls.  Altered mental status. EXAM: RIGHT TIBIA AND FIBULA - 2 VIEW; LEFT TIBIA AND FIBULA - 2 VIEW COMPARISON:  None Available. FINDINGS: No acute fracture or dislocation. No aggressive osseous lesion. There are mild-to-moderate degenerative changes of bilateral knee joints. Bilateral ankle mortise appear intact. No focal soft tissue swelling. No radiopaque foreign bodies. IMPRESSION: No acute osseous abnormality of bilateral tibia and fibula. Electronically Signed   By: Jules Schick M.D.   On: 10/04/2023 09:43   DG Tibia/Fibula Left Result Date: 10/04/2023 CLINICAL DATA:  Multiple falls.  Altered mental status. EXAM: RIGHT TIBIA AND FIBULA - 2 VIEW; LEFT TIBIA AND FIBULA - 2 VIEW COMPARISON:  None Available. FINDINGS: No acute fracture or dislocation. No aggressive osseous lesion. There are mild-to-moderate degenerative changes of bilateral knee joints. Bilateral ankle mortise appear intact. No  focal soft tissue swelling. No radiopaque foreign bodies. IMPRESSION: No acute osseous abnormality of bilateral tibia and fibula. Electronically Signed   By: Jules Schick M.D.   On: 10/04/2023 09:43   CT HEAD WO CONTRAST ( ) Result Date: 10/04/2023 CLINICAL DATA:  Stand for follow-up subdural hematomas. EXAM: CT HEAD WITHOUT CONTRAST TECHNIQUE: Contiguous axial images were obtained from the base of the skull through the vertex without intravenous contrast. RADIATION DOSE REDUCTION: This exam was performed according to the departmental dose-optimization program which includes automated exposure control, adjustment of the mA and/or kV according to patient size and/or use of iterative reconstruction technique. COMPARISON:  October 03, 2023 FINDINGS: Brain: There is generalized cerebral atrophy with widening of the  extra-axial spaces and ventricular dilatation. There are areas of decreased attenuation within the white matter tracts of the supratentorial brain, consistent with microvascular disease changes. Stable, chronic, approximately 6 mm thick bilateral subdural hematomas versus subdural hygromas are seen. A small amount of superimposed acute blood is noted within the frontotemporal region on the right. This is stable in appearance when compared to the prior exam. Stable subcentimeter foci of increased attenuation are also again seen within the left frontal lobe (axial CT images 18 and 20, CT series 2/sagittal reformatted image 30, CT series 5). There is no evidence of mass effect or midline shift. Vascular: Moderate to marked severity bilateral cavernous carotid artery calcification is noted. Skull: Normal. Negative for fracture or focal lesion. Sinuses/Orbits: No acute finding. Other: None. IMPRESSION: 1. Stable, chronic, approximately 6 mm thick bilateral subdural hematomas versus subdural hygromas. 2. Small amount of superimposed acute blood within the frontotemporal region on the right, stable in appearance when compared to the prior exam. 3. Stable subcentimeter foci of increased attenuation within the left frontal lobe, which may represent small foci of acute hemorrhage. MRI correlation is recommended. 4. Generalized cerebral atrophy and microvascular disease changes of the supratentorial brain. Electronically Signed   By: Aram Candela M.D.   On: 10/04/2023 02:39   CT Maxillofacial Wo Contrast Result Date: 10/04/2023 CLINICAL DATA:  Fall, bruising to left eye and face. EXAM: CT MAXILLOFACIAL WITHOUT CONTRAST TECHNIQUE: Multidetector CT imaging of the maxillofacial structures was performed. Multiplanar CT image reconstructions were also generated. RADIATION DOSE REDUCTION: This exam was performed according to the departmental dose-optimization program which includes automated exposure control, adjustment of  the mA and/or kV according to patient size and/or use of iterative reconstruction technique. COMPARISON:  None Available. FINDINGS: Osseous: No fracture or mandibular dislocation. No destructive process. Orbits: Negative. No traumatic or inflammatory finding. Sinuses: Clear Soft tissues: Negative Limited intracranial: See head CT report IMPRESSION: No facial or orbital fracture. Electronically Signed   By: Charlett Nose M.D.   On: 10/04/2023 00:16   CT Cervical Spine Wo Contrast Result Date: 10/04/2023 CLINICAL DATA:  Neck trauma (Age >= 65y). Fall 2 days ago. Bruising to left eye and face. EXAM: CT CERVICAL SPINE WITHOUT CONTRAST TECHNIQUE: Multidetector CT imaging of the cervical spine was performed without intravenous contrast. Multiplanar CT image reconstructions were also generated. RADIATION DOSE REDUCTION: This exam was performed according to the departmental dose-optimization program which includes automated exposure control, adjustment of the mA and/or kV according to patient size and/or use of iterative reconstruction technique. COMPARISON:  None Available. FINDINGS: Alignment: Normal Skull base and vertebrae: No acute fracture. No primary bone lesion or focal pathologic process. Soft tissues and spinal canal: No prevertebral fluid or swelling. No visible canal hematoma. Disc levels:  Diffuse degenerative  disc disease and facet disease. Upper chest: No acute findings Other: None IMPRESSION: Degenerative disc and facet disease.  No acute bony abnormality. Electronically Signed   By: Charlett Nose M.D.   On: 10/04/2023 00:14   DG Chest 2 View Result Date: 10/03/2023 CLINICAL DATA:  Weakness EXAM: CHEST - 2 VIEW COMPARISON:  07/23/2023 FINDINGS: Heart mediastinal contours within normal limits. Minimal bibasilar linear opacities, scarring versus atelectasis. No effusions. No acute bony abnormality. IMPRESSION: Bibasilar scarring or atelectasis. No active disease. Electronically Signed   By: Charlett Nose  M.D.   On: 10/03/2023 20:28   CT Head Wo Contrast Result Date: 10/03/2023 CLINICAL DATA:  Head trauma, minor (Age >= 65y).  Fall 2 days ago. EXAM: CT HEAD WITHOUT CONTRAST TECHNIQUE: Contiguous axial images were obtained from the base of the skull through the vertex without intravenous contrast. RADIATION DOSE REDUCTION: This exam was performed according to the departmental dose-optimization program which includes automated exposure control, adjustment of the mA and/or kV according to patient size and/or use of iterative reconstruction technique. COMPARISON:  10/10/2014 FINDINGS: Brain: There are low-density subdural fluid collections bilaterally compatible with subdural hygromas or chronic subdural hematomas, 6 mm bilaterally. Small amount of high density subdural blood noted over the right frontotemporal region compatible with an acute component. No intraparenchymal hemorrhage. No hydrocephalus. No midline shift. Vascular: No hyperdense vessel or unexpected calcification. Skull: No acute calvarial abnormality. 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 A superimposed acute component of the right subdural hematoma noted. These results were called by telephone at the time of interpretation on 10/03/2023 at 8:09 pm to provider [facility] , who verbally acknowledged these results. Electronically Signed   By: Charlett Nose M.D.   On: 10/03/2023 20:11    Microbiology: Results for orders placed or performed during the hospital encounter of 11/19/21  C Difficile Quick Screen w PCR reflex     Status: None   Collection Time: 11/19/21  3:19 AM   Specimen: STOOL  Result Value Ref Range Status   C Diff antigen NEGATIVE NEGATIVE Final   C Diff toxin NEGATIVE NEGATIVE Final   C Diff interpretation No C. difficile detected.  Final    Comment: Performed at [facility], [address] [place], [place] [address]     Labs: CBC: Recent Labs  Lab 10/03/23 2255  WBC 9.6  HGB 13.1  HCT 40.9  MCV 103.0*  PLT 249   Basic Metabolic Panel: Recent Labs  Lab 10/03/23 2300 10/04/23 1446 10/05/23 0653 10/06/23 0459 10/07/23 0318  NA 129* 126* 126* 129* 135  K 4.7 3.9 3.6 3.5 3.5  CL 95* 90* 93* 96* 96*  CO2 18* 26 28 27 24   GLUCOSE 175* 226* 117* 178* 157*  BUN 22 30* 33* 34* 46*  CREATININE 0.63 0.80 0.82 0.61 0.64  CALCIUM 9.0 9.1 8.8* 8.8* 9.6   Liver Function Tests: No results for input(s): "AST", "ALT", "ALKPHOS", "BILITOT", "PROT", "ALBUMIN" in the last 168 hours. CBG: No results for input(s): "GLUCAP" in the last 168 hours.  Discharge time spent: 38 minutes.  Signed: Marcelino Duster, MD [facility] 10/08/2023

## 2023-10-08 NOTE — TOC Transition Note (Addendum)
 Transition of Care [facility]) - Discharge Note   Patient Details  Name: Hannah Dillon MRN: [id number] Date of Birth: [birth year]  Transition of Care [facility]) CM/SW Contact:  Cherre Blanc, RN Phone Number: 10/08/2023, 10:04 AM   Clinical Narrative:     Patient is medically clear for dc to home with [facility] hospice. Patient will receive [facility] via [facility]. Transportation to home arranged with [employer]. Patient's son is in the room. TOC made nurse aware of transportation setup.  Final next level of care: Home w Hospice Care Barriers to Discharge: No Barriers Identified   Patient Goals and CMS Choice            Discharge Placement                       Discharge Plan and Services Additional resources added to the After Visit Summary for     Discharge Planning Services: CM Consult Post Acute Care Choice: Hospice, Skilled Nursing Facility                               Social Drivers of Health (SDOH) Interventions SDOH Screenings   Food Insecurity: No Food Insecurity (10/04/2023)  Housing: Low Risk  (10/04/2023)  Transportation Needs: No Transportation Needs (10/04/2023)  Utilities: Not At Risk (10/04/2023)  Social Connections: Moderately Isolated (10/04/2023)  Tobacco Use: Low Risk  (10/03/2023)     Readmission Risk Interventions     No data to display

## 2023-10-09 NOTE — Telephone Encounter (Signed)
 Patient was discharged home with Hospice care. Should we still contact her for follow-up appt?

## 2023-10-10 ENCOUNTER — Telehealth (HOSPITAL_COMMUNITY): Payer: Self-pay | Admitting: Pharmacy Technician

## 2023-10-10 NOTE — Telephone Encounter (Signed)
 Pharmacy Patient Advocate Encounter  Received notification from [facility] that Prior Authorization for Ondansetron 4MG  dispersible tablets  has been DENIED.  Full denial letter will be uploaded to the media tab. See denial reason below.   PA #/Case ID/Reference #: [id number]

## 2023-10-10 NOTE — Telephone Encounter (Signed)
 Pharmacy Patient Advocate Encounter   Received notification from Fax that prior authorization for Ondansetron 4MG  dispersible tablets is required/requested.   Insurance verification completed.   The patient is insured through [employer] .   Per test claim: PA required; PA submitted to above mentioned insurance via CoverMyMeds Key/confirmation #/EOC [id number] Status is pending

## 2023-10-14 NOTE — Telephone Encounter (Signed)
 I spoke to [place] the [place] and her son. He has declined the CT and follow-up appt. The hospice nurse told him there was no reason for her to have the CT or appt.

## 2023-10-14 NOTE — Addendum Note (Signed)
 Addended by: Barbette Hair on: 10/14/2023 09:55 AM   Modules accepted: Orders

## 2023-10-18 ENCOUNTER — Ambulatory Visit

## 2023-11-05 IMAGING — CT CT ABD-PELV W/ CM
2 of 5 series · 15 of 46 positions shown, 17 images · IV contrast (APPLIED)
Comparison: CTA chest 08/02/2021.

CLINICAL DATA: [AGE] female with several days of diarrhea,
not improving with Imodium.

EXAM:
CT ABDOMEN AND PELVIS WITH CONTRAST
TECHNIQUE: Multidetector CT imaging of the abdomen and pelvis was performed
using the standard protocol following bolus administration of
intravenous contrast.

[Series 2: routine abd/pel with · axial · 0.78mm/px · z∈[-1059,-659]mm · 12 of 91 slices shown, 14 images]
[im 6/91  soft-tissue]
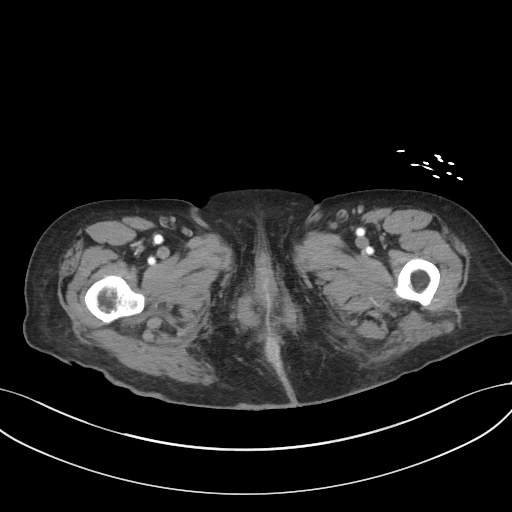
[im 6/91  bone]
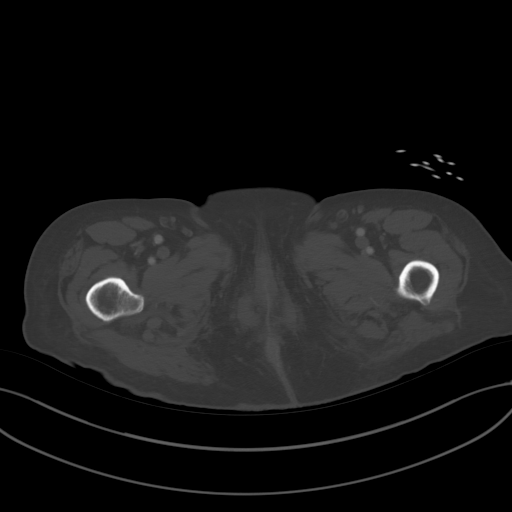
[im 16/91  soft-tissue]
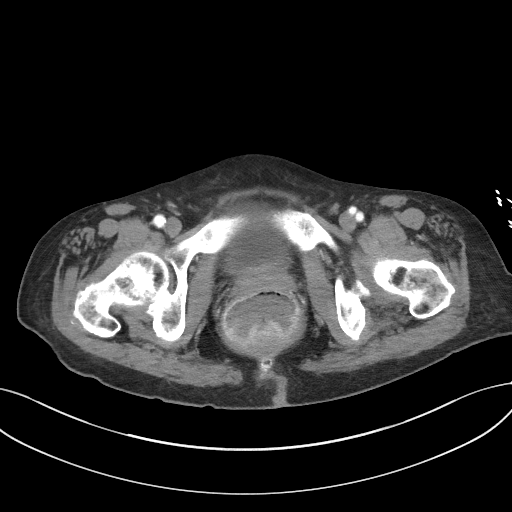
[im 21/91  soft-tissue]
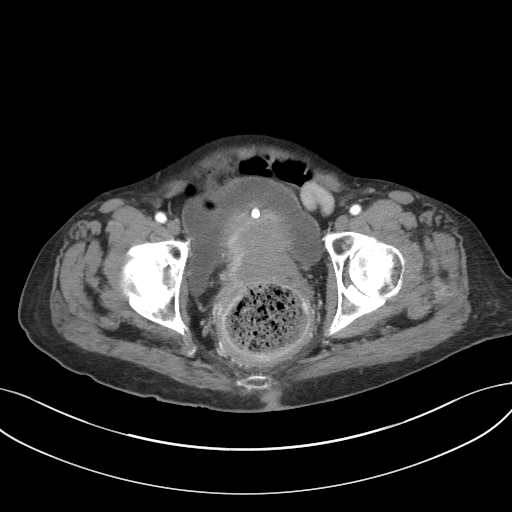
[im 26/91  soft-tissue]
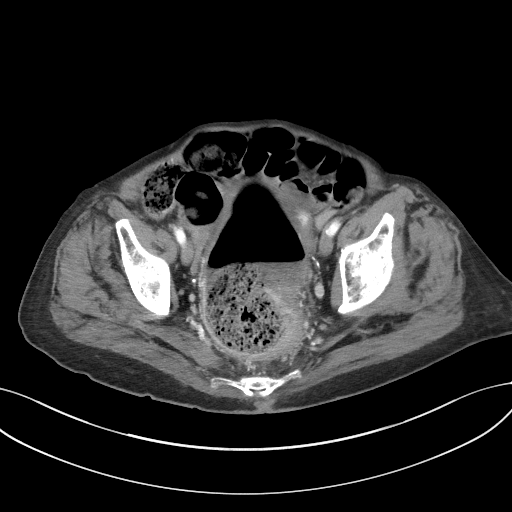
[im 36/91  soft-tissue]
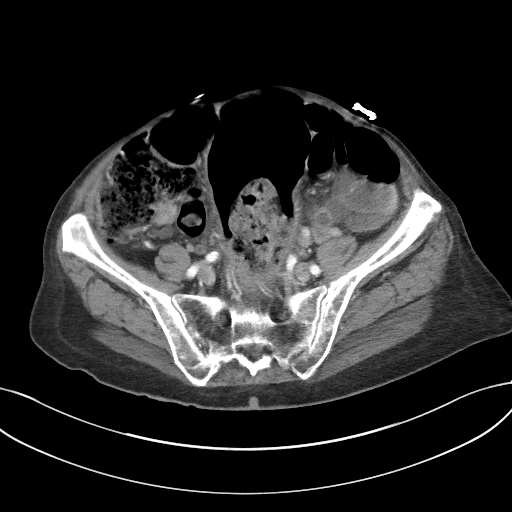
[im 41/91  soft-tissue]
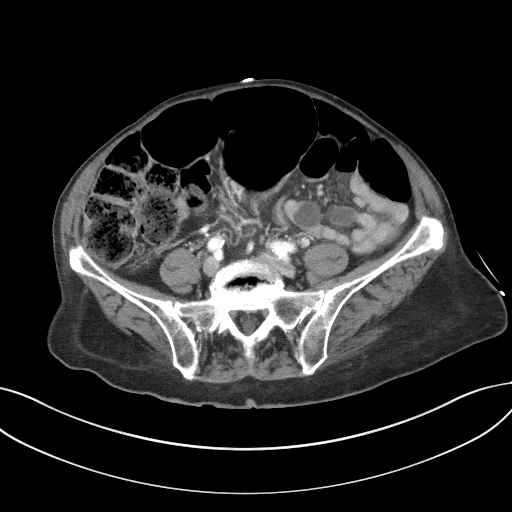
[im 51/91  soft-tissue]
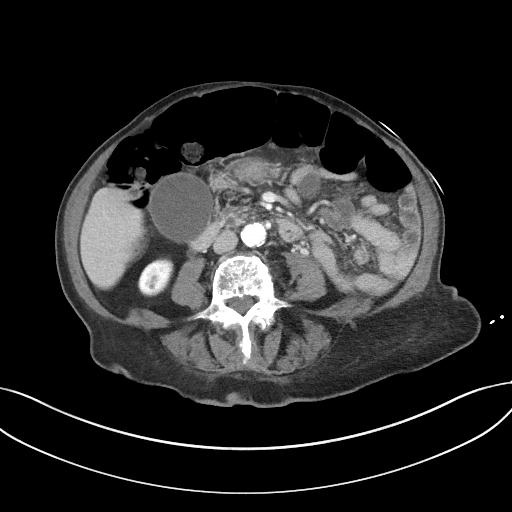
[im 56/91  soft-tissue]
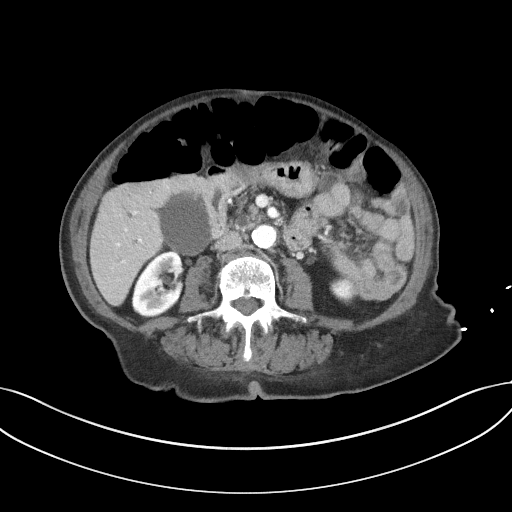
[im 66/91  soft-tissue]
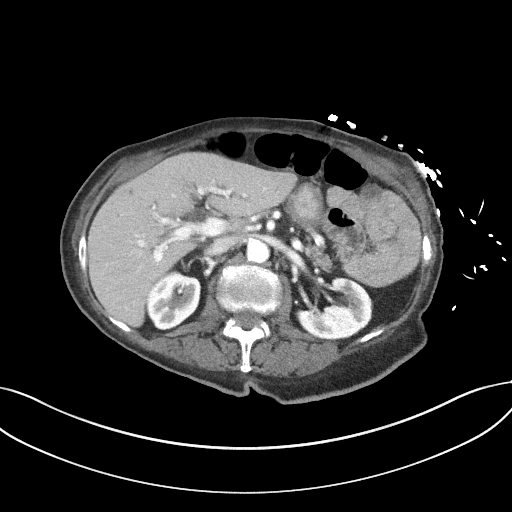
[im 66/91  bone]
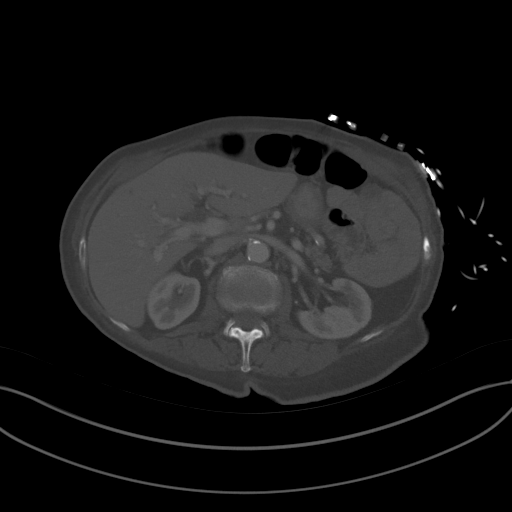
[im 71/91  soft-tissue]
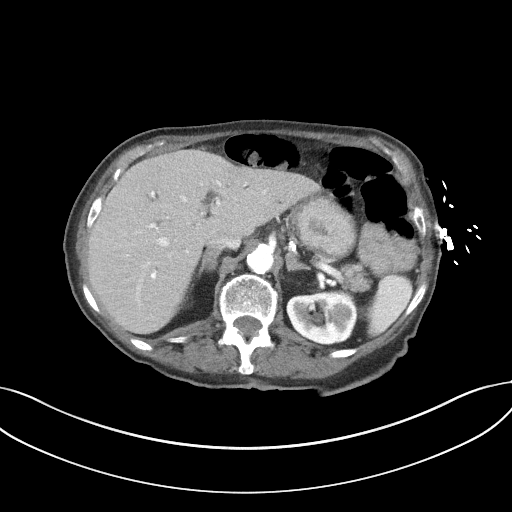
[im 76/91  soft-tissue]
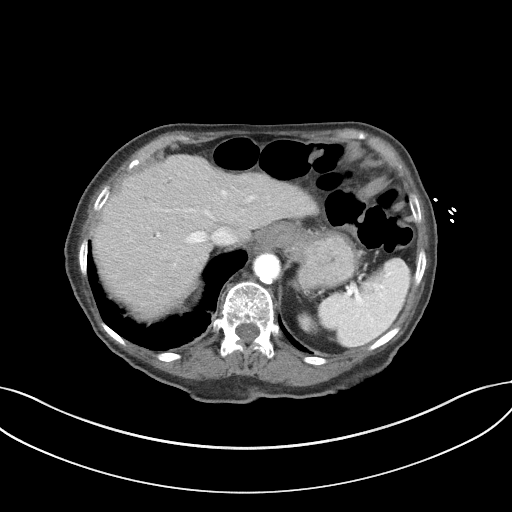
[im 86/91  soft-tissue]
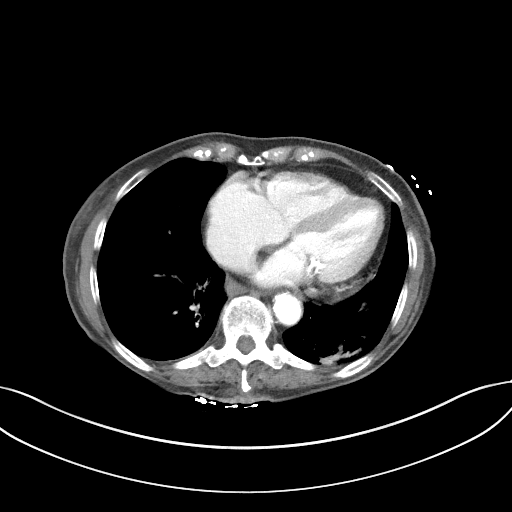

[Series 5: coronal st · coronal · 0.76mm/px · 3 of 87 slices shown]
[im 29/87  soft-tissue]
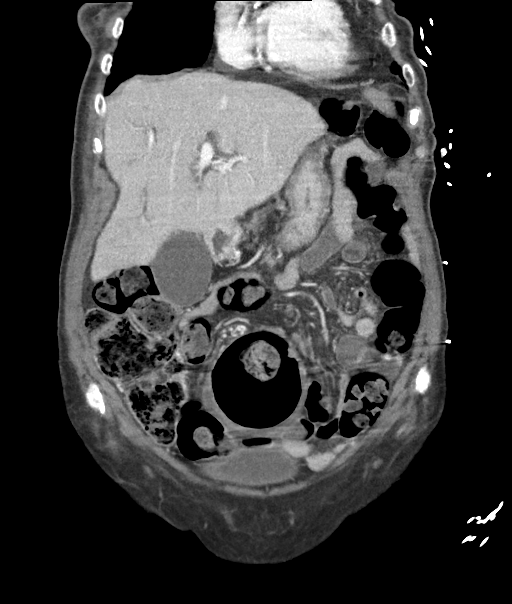
[im 39/87  soft-tissue]
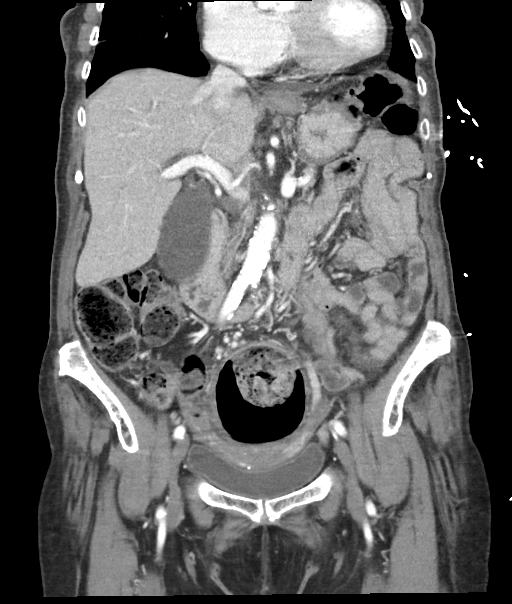
[im 48/87  soft-tissue]
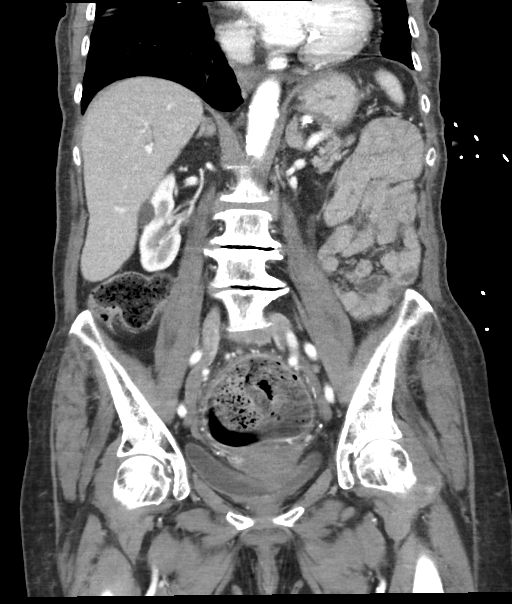

[15 of 46 positions shown; findings below may reference images not displayed]

RADIATION DOSE REDUCTION: This exam was performed according to the
departmental dose-optimization program which includes automated
exposure control, adjustment of the mA and/or kV according to
patient size and/or use of iterative reconstruction technique.

CONTRAST:  100mL OMNIPAQUE IOHEXOL 300 MG/ML  SOLN
FINDINGS: Lower chest: Cardiomegaly is mild-to-moderate. No pericardial
effusion. Platelike middle and lower lobe lung base opacity greater
on the left most resembles atelectasis or scarring. No pleural
effusion.

Hepatobiliary: Gallbladder and bile ducts are distended. There is no
pericholecystic inflammation. The CBD is up to 12 mm and slowly
tapers distally on coronal image 42. There is mild to moderate
central intrahepatic biliary ductal dilatation. No gallstone or
obstructing etiology identified. Liver enhancement remains normal.

Pancreas: Atrophied.

Spleen: Negative.

Adrenals/Urinary Tract: Adrenal glands and kidneys are within normal
limits for age. Renal enhancement and contrast excretion is
symmetric. Urinary bladder is mildly distended with mass effect from
enlarged distal colon.

Stomach/Bowel: Elongated, impacted stool in the distal sigmoid colon
and rectum (sagittal image 68) with superimposed rectal mucosal
thickening and hyperenhancement distally (series 2, image 77). See
also coronal image 70.

Upstream dilated, highly redundant gas-filled sigmoid colon. Highly
redundant mostly gas containing but less distended descending colon.
Similar gas and occasional retained stool throughout the transverse
colon. Retained stool in redundant right colon. No large bowel
volvulus identified. No convincing large bowel inflammation outside
of the rectal mucosa. Diminutive or absent appendix. Terminal ileum
is decompressed.

Nondilated small bowel, intermittently loops in the left abdomen are
fluid containing. Stomach is decompressed. Duodenum is decompressed.
No free air or free fluid identified.

Vascular/Lymphatic: Extensive Aortoiliac calcified atherosclerosis.
Major arterial structures in the abdomen and pelvis remain patent.
No lymphadenopathy identified. Portal venous system is patent.

Reproductive: Within normal limits; small dystrophic calcification
at the uterine fundus.

Other: No pelvic free fluid.

Musculoskeletal: Degenerative changes in the spine. No acute or
suspicious osseous lesion identified.
IMPRESSION: 1. Dilated and redundant large bowel, especially throughout the
lower abdomen and pelvis.
No volvulus, but abundant retained stool in the rectosigmoid colon
and conspicuous mucosal hyperenhancement of the rectum.
This might reflect a Fecal Impaction with superimposed Acute
Proctitis. But an Obstructing Rectal Mass is difficult to exclude.

2. No dilatation of the small bowel or stomach. No free air or free
fluid.

3. Generalized dilated intra- and extrahepatic bile ducts with
distended gallbladder is of unclear etiology and significance. No
gallstone or obstructing etiology identified. Query
hyperbilirubinemia.

4. Cardiomegaly. Aortic Atherosclerosis ([id number]).

## 2023-12-15 DEATH — deceased
